# Patient Record
Sex: Female | Born: 1985 | Race: White | Hispanic: No | Marital: Married | Smoking: Never smoker
Health system: Southern US, Community
[De-identification: ages and names within clinical notes are randomized; demographics above are authoritative.]

## PROBLEM LIST (undated history)

## (undated) DIAGNOSIS — O24419 Gestational diabetes mellitus in pregnancy, unspecified control: Secondary | ICD-10-CM

## (undated) DIAGNOSIS — O039 Complete or unspecified spontaneous abortion without complication: Secondary | ICD-10-CM

## (undated) DIAGNOSIS — O321XX0 Maternal care for breech presentation, not applicable or unspecified: Secondary | ICD-10-CM

## (undated) DIAGNOSIS — Z9289 Personal history of other medical treatment: Secondary | ICD-10-CM

## (undated) DIAGNOSIS — F419 Anxiety disorder, unspecified: Secondary | ICD-10-CM

## (undated) DIAGNOSIS — Z8619 Personal history of other infectious and parasitic diseases: Secondary | ICD-10-CM

## (undated) DIAGNOSIS — Z6790 Unspecified blood type, Rh positive: Secondary | ICD-10-CM

## (undated) HISTORY — DX: Personal history of other infectious and parasitic diseases: Z86.19

## (undated) HISTORY — DX: Complete or unspecified spontaneous abortion without complication: O03.9

## (undated) HISTORY — DX: Personal history of other medical treatment: Z92.89

## (undated) HISTORY — DX: Maternal care for breech presentation, not applicable or unspecified: O32.1XX0

## (undated) HISTORY — DX: Unspecified blood type, Rh positive: Z67.90

---
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
## 2006-01-28 ENCOUNTER — Emergency Department (HOSPITAL_COMMUNITY): Admission: EM | Admit: 2006-01-28 | Discharge: 2006-01-28 | Payer: Self-pay | Admitting: Emergency Medicine

## 2008-10-04 DIAGNOSIS — O039 Complete or unspecified spontaneous abortion without complication: Secondary | ICD-10-CM

## 2008-10-04 HISTORY — DX: Complete or unspecified spontaneous abortion without complication: O03.9

## 2009-10-04 HISTORY — PX: GANGLION CYST EXCISION: SHX1691

## 2010-08-15 DIAGNOSIS — O321XX0 Maternal care for breech presentation, not applicable or unspecified: Secondary | ICD-10-CM

## 2010-08-15 HISTORY — DX: Maternal care for breech presentation, not applicable or unspecified: O32.1XX0

## 2010-08-24 ENCOUNTER — Ambulatory Visit: Payer: Self-pay

## 2010-08-25 ENCOUNTER — Inpatient Hospital Stay: Payer: Self-pay | Admitting: Obstetrics and Gynecology

## 2012-07-12 HISTORY — PX: INTRAUTERINE DEVICE (IUD) INSERTION: SHX5877

## 2013-11-22 ENCOUNTER — Ambulatory Visit: Payer: Self-pay | Admitting: Family Medicine

## 2013-12-02 HISTORY — PX: UPPER GI ENDOSCOPY: SHX6162

## 2013-12-24 ENCOUNTER — Ambulatory Visit: Payer: Self-pay | Admitting: Gastroenterology

## 2015-01-22 ENCOUNTER — Inpatient Hospital Stay
Admit: 2015-01-22 | Disposition: A | Discharge status: Home / Self Care | Payer: Self-pay | Attending: Obstetrics and Gynecology | Admitting: Obstetrics and Gynecology

## 2015-01-22 LAB — CBC WITH DIFFERENTIAL/PLATELET
BASOS ABS: 0 10*3/uL (ref 0.0–0.1)
Basophil %: 0.1 %
Eosinophil #: 0.1 10*3/uL (ref 0.0–0.7)
Eosinophil %: 0.6 %
HCT: 34.1 % — ABNORMAL LOW (ref 35.0–47.0)
HGB: 11.5 g/dL — ABNORMAL LOW (ref 12.0–16.0)
Lymphocyte #: 1.2 10*3/uL (ref 1.0–3.6)
Lymphocyte %: 10.7 %
MCH: 30.3 pg (ref 26.0–34.0)
MCHC: 33.8 g/dL (ref 32.0–36.0)
MCV: 90 fL (ref 80–100)
MONOS PCT: 5.8 %
Monocyte #: 0.7 x10 3/mm (ref 0.2–0.9)
Neutrophil #: 9.3 10*3/uL — ABNORMAL HIGH (ref 1.4–6.5)
Neutrophil %: 82.8 %
PLATELETS: 117 10*3/uL — AB (ref 150–440)
RBC: 3.81 10*6/uL (ref 3.80–5.20)
RDW: 13.5 % (ref 11.5–14.5)
WBC: 11.2 10*3/uL — AB (ref 3.6–11.0)

## 2015-01-23 LAB — HEMATOCRIT: HCT: 31.5 % — AB (ref 35.0–47.0)

## 2015-02-10 ENCOUNTER — Other Ambulatory Visit: Payer: Self-pay

## 2015-02-11 ENCOUNTER — Inpatient Hospital Stay: Admit: 2015-02-11 | Payer: BC Managed Care – PPO | Admitting: Obstetrics and Gynecology

## 2015-02-11 SURGERY — Surgical Case
Anesthesia: Choice

## 2015-02-11 NOTE — H&P (Signed)
L&D Evaluation:  History:  HPI [age] year old G3P1011 [facility] with preterm premature rupture of mebranes.  +FM, mild irregular contractions, no VB.    [facility] unremark[facility] other than history of prior LTCS for breech in 2011.  Did have echogenic focus, resolved on repeat ultrasound, negative quad screen.   Presents with leaking fluid   Patient's Medical History No Chronic Illness   Patient's Surgical History none   Medications Pre Natal Vitamins   Allergies NKDA   Social History none   Family History Non-Contributory   ROS:  ROS All systems were reviewed.  HEENT, CNS, GI, GU, Respiratory, CV, Renal and Musculoskeletal systems were found to be normal.   Exam:  Vital Signs stable   Urine Protein not completed   General no apparent distress   Mental Status clear   Heart normal sinus rhythm   Abdomen gravid, non-tender   Estimated Fetal Weight Average for gestational age   Fetal Position vtx   Back no CVAT   Edema no edema   Impression:  Impression 29 year old 473P1011 at 3545w5d presenting with preterm premature rupture of mebranes   Plan:  Comments 1) TOLAC - counseled regarding risk of TOLAC, uterine rupture rate of 1%, no clear data regarding any increased risk of ruputre with single vs double layer closure (unclear per patient prior operative note) - monitor for cervical change - if no change augment with pitocin - prior C-section for breech  2) Fetus - category I tracing - 30lbs weight gain this pregnancy - augmentin for GBS unknown  - BMZ administration (ACOG Practice Advisory: Antenatal Corticosteroid Administration in the Late Preterm Period)  3) O pos / ABSC neg / RI / VZI / HIV neg / RPR NR / 1-hr 126mg  / H&H 10.7 & 31.2 / GBS unknown  4) TDAP 12/16/13  5) Disposition pending delivery   Electronic Signatures: Lorrene ReidStaebler, Ivalee Strauser M (MD)  (Signed 20-Apr-16 07:07)  Authored: L&D Evaluation   Last Updated:  20-Apr-16 07:07 by Lorrene ReidStaebler, Radiance Deady M (MD)

## 2015-03-13 LAB — HM PAP SMEAR: HM Pap smear: NEGATIVE

## 2015-08-14 IMAGING — US ABDOMEN ULTRASOUND
1 series · 14 of 25 positions shown · non-contrast
Comparison: None.

CLINICAL DATA: Epigastric pain and abdominal bloating.

EXAM:
ULTRASOUND ABDOMEN COMPLETE

[Series 1: abdomen ultrasound · 0.20mm/px · 14 of 66 slices shown]
[im 1/66]
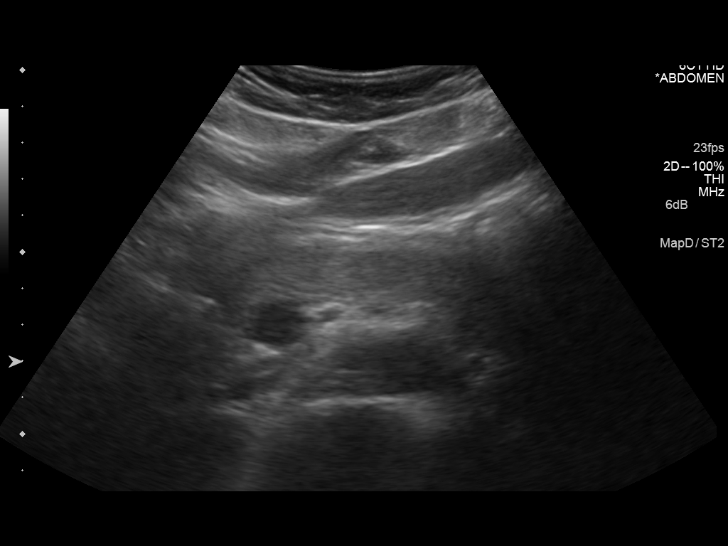
[im 6/66]
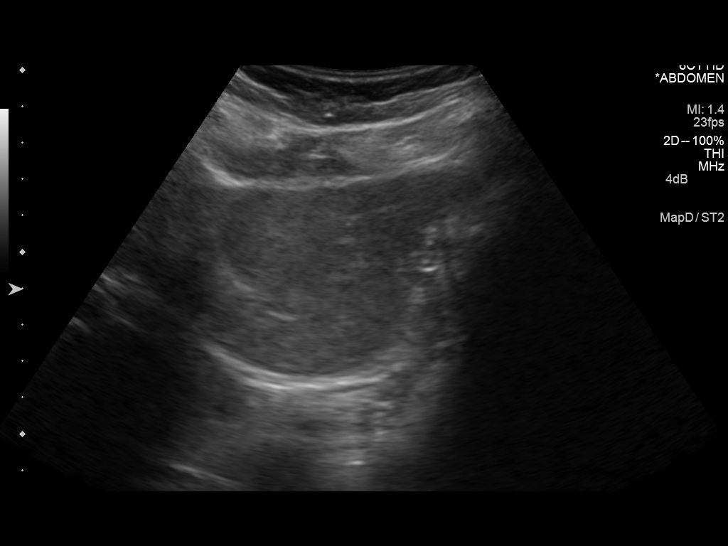
[im 11/66]
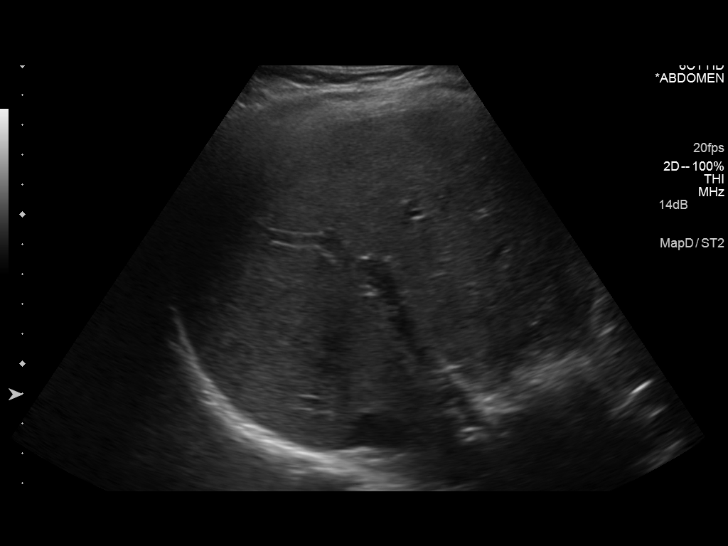
[im 17/66]
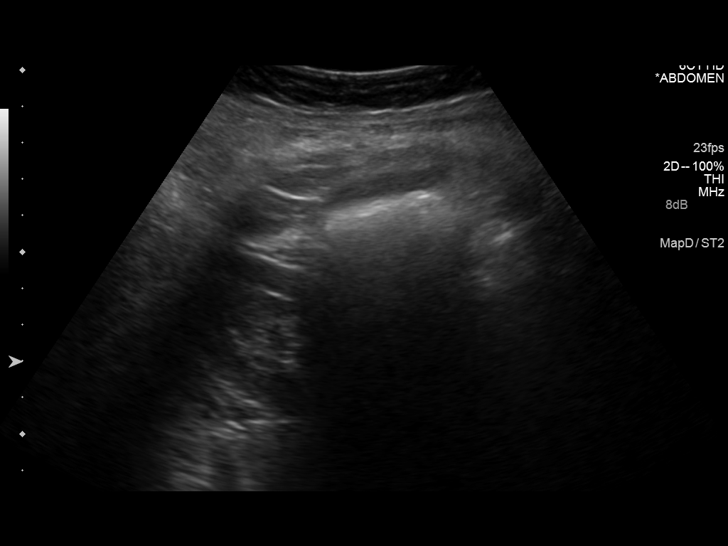
[im 22/66]
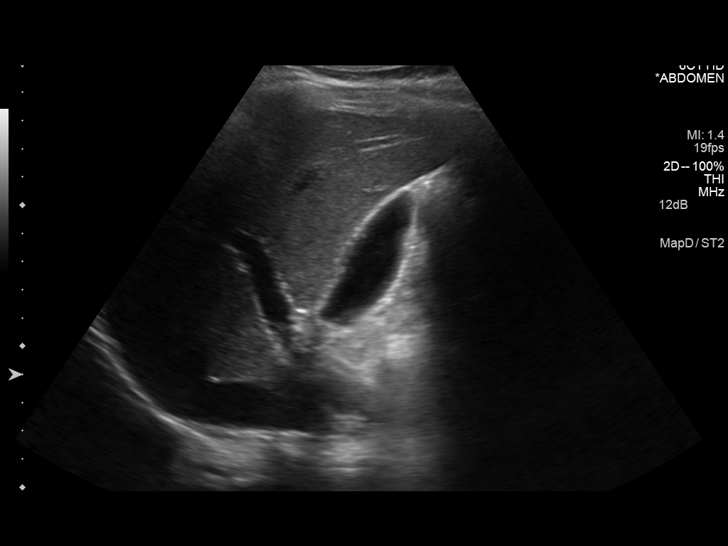
[im 25/66]
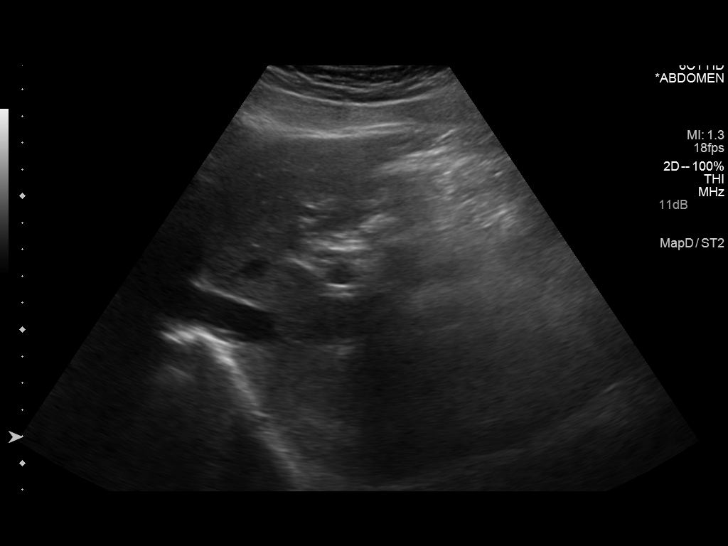
[im 30/66]
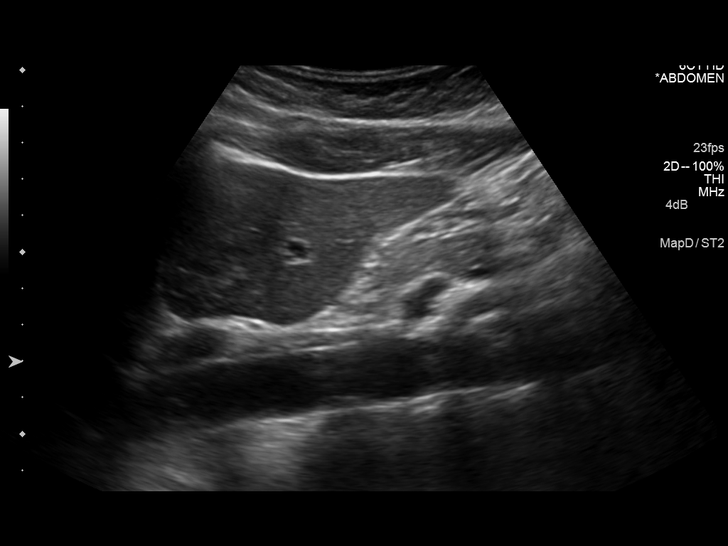
[im 36/66]
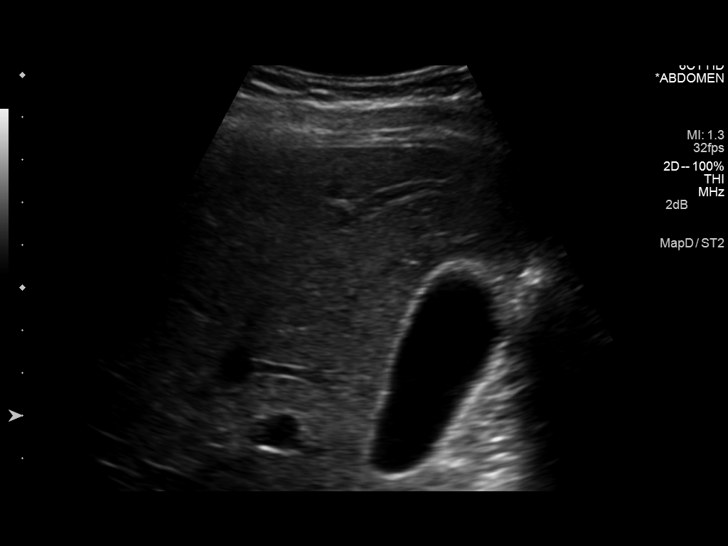
[im 41/66]
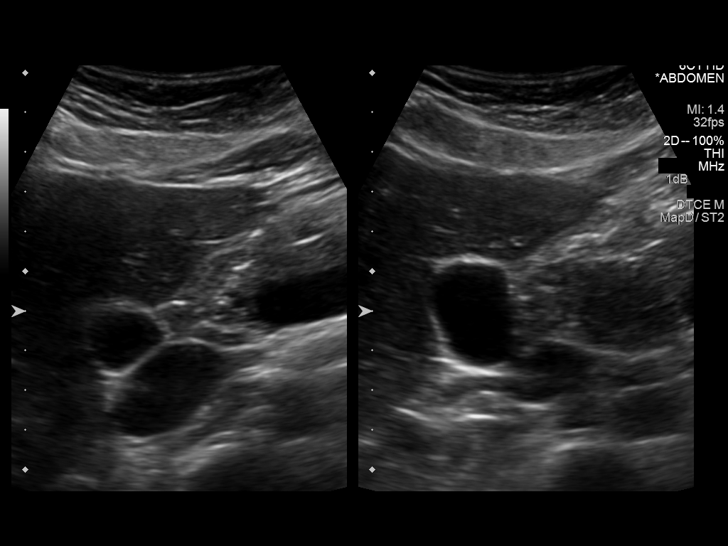
[im 44/66]
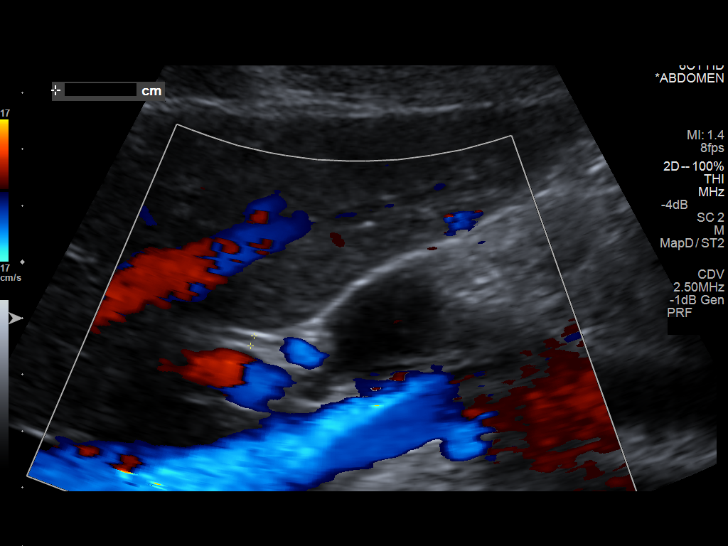
[im 49/66]
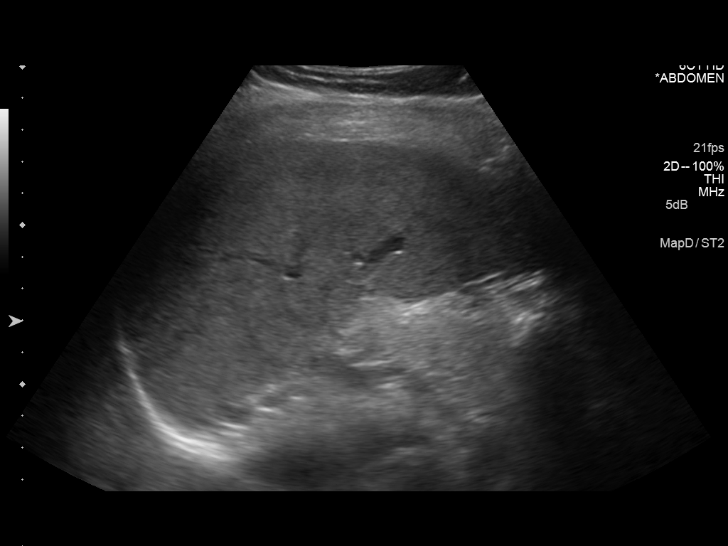
[im 55/66]
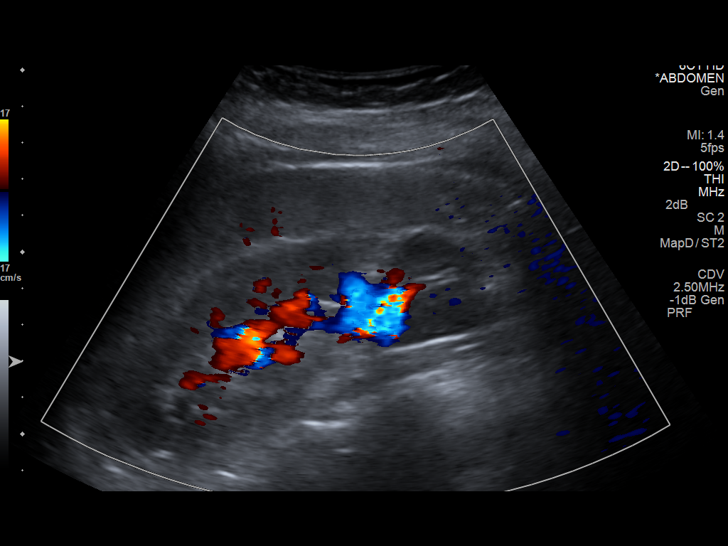
[im 60/66]
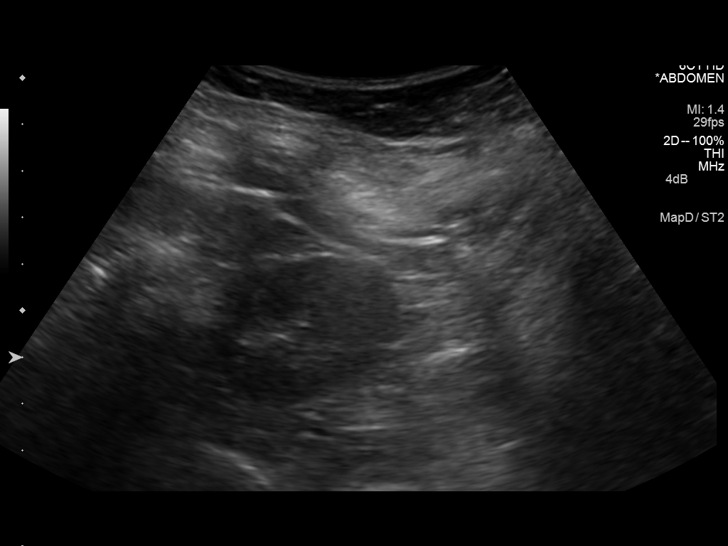
[im 66/66]
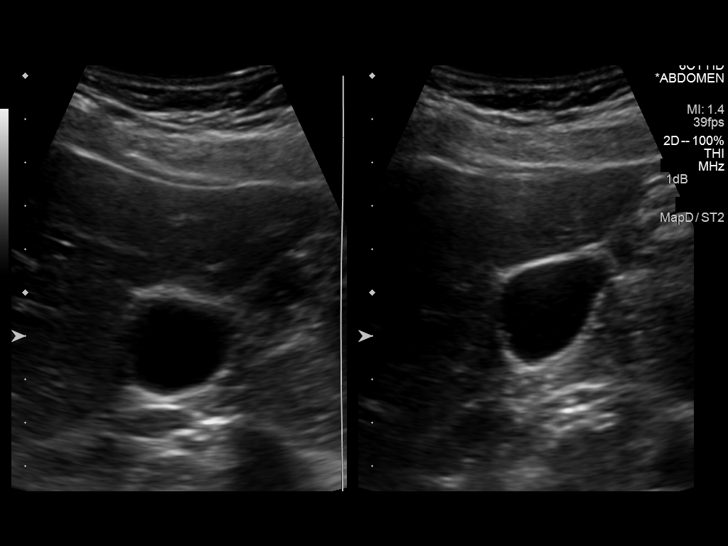

[14 of 25 positions shown; findings below may reference images not displayed]

FINDINGS: Gallbladder:

No gallstones or wall thickening visualized. No sonographic Murphy
sign noted.

Common bile duct:

Diameter: 2.4 mm.

Liver:

No focal lesion identified. Within normal limits in parenchymal
echogenicity.

IVC:

No abnormality visualized.

Pancreas:

Visualized portion unremarkable.

Spleen:

Mild splenomegaly as the spleen measures 12.3 cm in greatest
diameter with volume 517 cm 3.

Right Kidney:

Length: 11.5 cm. Echogenicity within normal limits. No mass or
hydronephrosis visualized.

Left Kidney:

Length: 12.1 cm. Echogenicity within normal limits. No mass or
hydronephrosis visualized.

Abdominal aorta:

No aneurysm visualized.

Other findings:

None.
IMPRESSION: No acute hepatobiliary findings.

Mild splenomegaly.

## 2016-05-13 ENCOUNTER — Ambulatory Visit (INDEPENDENT_AMBULATORY_CARE_PROVIDER_SITE_OTHER): Payer: BC Managed Care – PPO | Admitting: Family Medicine

## 2016-05-13 VITALS — BP 114/72 | HR 70 | Temp 98.1°F | Ht 61.0 in | Wt 144.0 lb

## 2016-05-13 DIAGNOSIS — R109 Unspecified abdominal pain: Secondary | ICD-10-CM | POA: Diagnosis not present

## 2016-05-13 MED ORDER — MELOXICAM 15 MG PO TABS: 15.0000 mg | ORAL_TABLET | Freq: Every day | ORAL | 0 refills | Status: DC

## 2016-05-13 NOTE — Progress Notes (Signed)
   BP 114/72   Pulse 70   Temp 98.1 F (36.7 C)   Ht 5\' 1"  (1.549 m)   Wt 144 lb (65.3 kg)   SpO2 99%   BMI 27.21 kg/m    Subjective:    Patient ID: Carolyn Harrison, female    DOB: March 06, 1986, 30 y.o.   MRN: [id number]  HPI: Carolyn Harrison is a 30 y.o. female  Chief Complaint  Patient presents with  . Abdominal Pain    x 1 week. left side, below rib cage. No N/V/D, no fever.   Patient presents with a 1 week history of left sided abdominal pain that seems to have started after boogie boarding at the beach last week. States the pain is 3-5/10 dull and achy at rest, 6+ and sharp when area is pressed. Located over last 2-3 ribs on lower left. Has been taking ibuprofen intermittently with some relief. No sick contacts, no new foods or foreign travel, and no N/V/D, constipation, anorexia, or fever.  Does have hx of h. Pylori infection in 2015, but states she completed successful treatment at that time and these symptoms don't feel similar.   Relevant past medical, surgical, family and social history reviewed and updated as indicated. Interim medical history since our last visit reviewed. Allergies and medications reviewed and updated.  Review of Systems  Constitutional: Negative.   Respiratory: Negative.   Cardiovascular: Negative.   Gastrointestinal: Positive for abdominal pain. Negative for constipation, diarrhea, nausea and vomiting.  Genitourinary: Negative for dysuria, flank pain, hematuria, pelvic pain and urgency.  Musculoskeletal: Negative.   Neurological: Negative.   Psychiatric/Behavioral: Negative.     Per HPI unless specifically indicated above     Objective:    BP 114/72   Pulse 70   Temp 98.1 F (36.7 C)   Ht 5\' 1"  (1.549 m)   Wt 144 lb (65.3 kg)   SpO2 99%   BMI 27.21 kg/m   Wt Readings from Last 3 Encounters:  05/13/16 144 lb (65.3 kg)    Physical Exam  Constitutional: She is oriented to person, place, and time. She appears well-developed and  well-nourished. No distress.  HENT:  Head: Atraumatic.  Eyes: Conjunctivae are normal. No scleral icterus.  Neck: Normal range of motion. Neck supple.  Cardiovascular: Normal rate and normal heart sounds.   Pulmonary/Chest: Effort normal and breath sounds normal.  Abdominal: Soft. Bowel sounds are normal. She exhibits no distension.  Moderately TTP over left lower ribs. No bruising or wound in area  Musculoskeletal: Normal range of motion.  Neurological: She is alert and oriented to person, place, and time.  Skin: Skin is warm and dry.  Psychiatric: She has a normal mood and affect. Her behavior is normal.  Nursing note and vitals reviewed.       Assessment & Plan:   Problem List Items Addressed This Visit    None    Visit Diagnoses    Abdominal pain, unspecified abdominal location    -  Primary   Suspect musculoskeletal origin, given reproducible nature and lack of symptoms suggesting internal illness. Will get basic labs just in case. Meloxicam sent.   Relevant Orders   CBC with Differential/Platelet   Comprehensive metabolic panel     Discussed red flag symptoms that she should go to the ER for. Discussed to call right away for worsening or changing symptoms.   Follow up plan: Return if symptoms worsen or fail to improve.

## 2016-05-13 NOTE — Patient Instructions (Signed)
Follow up as needed

## 2016-05-14 ENCOUNTER — Encounter: Payer: Self-pay | Admitting: Family Medicine

## 2016-05-14 LAB — COMPREHENSIVE METABOLIC PANEL
A/G RATIO: 2 (ref 1.2–2.2)
ALBUMIN: 4.5 g/dL (ref 3.5–5.5)
ALT: 14 IU/L (ref 0–32)
AST: 19 IU/L (ref 0–40)
Alkaline Phosphatase: 67 IU/L (ref 39–117)
BILIRUBIN TOTAL: 0.5 mg/dL (ref 0.0–1.2)
BUN / CREAT RATIO: 13 (ref 9–23)
BUN: 9 mg/dL (ref 6–20)
CALCIUM: 9.4 mg/dL (ref 8.7–10.2)
CO2: 23 mmol/L (ref 18–29)
Chloride: 98 mmol/L (ref 96–106)
Creatinine, Ser: 0.7 mg/dL (ref 0.57–1.00)
GFR calc non Af Amer: 115 mL/min/{1.73_m2} (ref >59)
GFR, EST AFRICAN AMERICAN: 133 mL/min/{1.73_m2} (ref >59)
Globulin, Total: 2.2 g/dL (ref 1.5–4.5)
Glucose: 112 mg/dL — ABNORMAL HIGH (ref 65–99)
POTASSIUM: 3.9 mmol/L (ref 3.5–5.2)
Sodium: 138 mmol/L (ref 134–144)
TOTAL PROTEIN: 6.7 g/dL (ref 6.0–8.5)

## 2016-05-14 LAB — CBC WITH DIFFERENTIAL/PLATELET
BASOS: 0 %
Basophils Absolute: 0 10*3/uL (ref 0.0–0.2)
EOS (ABSOLUTE): 0.1 10*3/uL (ref 0.0–0.4)
Eos: 1 %
HEMATOCRIT: 39.4 % (ref 34.0–46.6)
HEMOGLOBIN: 13.1 g/dL (ref 11.1–15.9)
IMMATURE GRANS (ABS): 0 10*3/uL (ref 0.0–0.1)
IMMATURE GRANULOCYTES: 0 %
Lymphocytes Absolute: 1.9 10*3/uL (ref 0.7–3.1)
Lymphs: 28 %
MCH: 29.2 pg (ref 26.6–33.0)
MCHC: 33.2 g/dL (ref 31.5–35.7)
MCV: 88 fL (ref 79–97)
MONOCYTES: 6 %
Monocytes Absolute: 0.4 10*3/uL (ref 0.1–0.9)
NEUTROS ABS: 4.4 10*3/uL (ref 1.4–7.0)
Neutrophils: 65 %
Platelets: 197 10*3/uL (ref 150–379)
RBC: 4.48 x10E6/uL (ref 3.77–5.28)
RDW: 12.8 % (ref 12.3–15.4)
WBC: 6.8 10*3/uL (ref 3.4–10.8)

## 2017-09-23 ENCOUNTER — Ambulatory Visit: Payer: Self-pay | Admitting: Maternal Newborn

## 2017-10-12 ENCOUNTER — Encounter: Payer: Self-pay | Admitting: Maternal Newborn

## 2017-10-12 ENCOUNTER — Ambulatory Visit (INDEPENDENT_AMBULATORY_CARE_PROVIDER_SITE_OTHER): Payer: BC Managed Care – PPO | Admitting: Maternal Newborn

## 2017-10-12 VITALS — BP 100/70 | HR 60 | Ht 61.0 in | Wt 145.0 lb

## 2017-10-12 DIAGNOSIS — Z01419 Encounter for gynecological examination (general) (routine) without abnormal findings: Secondary | ICD-10-CM

## 2017-10-12 DIAGNOSIS — Z124 Encounter for screening for malignant neoplasm of cervix: Secondary | ICD-10-CM

## 2017-10-12 DIAGNOSIS — R87619 Unspecified abnormal cytological findings in specimens from cervix uteri: Secondary | ICD-10-CM | POA: Diagnosis not present

## 2017-10-12 DIAGNOSIS — N644 Mastodynia: Secondary | ICD-10-CM | POA: Insufficient documentation

## 2017-10-12 NOTE — Progress Notes (Signed)
Gynecology Annual Exam  PCP: Particia NearingLane, Rachel Elizabeth, PA-C  Chief Complaint:  Chief Complaint  Patient presents with  . Gynecologic Exam    soreness right breast    History of Present Illness: Patient is a 32 y.o. [id number] presents for annual exam. The patient has had some pain in her right breast, especially felt during exercise. She denies nipple discharge, skin changes, and changes in breast size and shape. The pain occurred once in December while she was exercising and resolved spontaneously a few days later. She also felt the pain again this month while she was on vacation and running upstairs. It again resolved without intervention and is not currently present. She is not taking any new medications and reports no injuries to the area.  LMP: No LMP recorded (lmp unknown). Patient is not currently having periods (Reason: IUD). Intermenstrual Bleeding: occasional spotting every once in a while Postcoital Bleeding: no  The patient is sexually active. She currently uses IUD for contraception. She denies dyspareunia.  The patient does perform self breast exams.  There is no notable family history of breast or ovarian cancer in her family.  The patient wears seatbelts: yes.   The patient has regular exercise: yes.    The patient denies current symptoms of depression.    Review of Systems  Constitutional: Negative.   HENT: Negative.   Eyes: Negative.   Respiratory: Negative for cough, shortness of breath and wheezing.   Cardiovascular: Negative for chest pain and palpitations.  Gastrointestinal: Negative for abdominal pain, constipation, diarrhea, heartburn and nausea.  Genitourinary: Negative.   Musculoskeletal: Negative.   Skin: Negative for rash.  Neurological: Negative.   Endo/Heme/Allergies: Negative.   Psychiatric/Behavioral: Negative for depression. The patient is not nervous/anxious.   Breasts: intermittent pain in right breast All other systems reviewed and  negative.  Past Medical History:  Past Medical History:  Diagnosis Date  . Breech presentation 08/15/2010  . History of Helicobacter pylori infection    2015  . History of Papanicolaou smear of cervix 07/09/2011; 03/13/15   NEG;ASCUS, HPV -;  . Rh(D) positive   . Spontaneous abortion 2010    Past Surgical History:  Past Surgical History:  Procedure Laterality Date  . CESAREAN SECTION  08/25/2010   BREECH PRESENTATION  . GANGLION CYST EXCISION Right 2011  . INTRAUTERINE DEVICE (IUD) INSERTION  07/12/2012  . TONSILLECTOMY  1994  . UPPER GI ENDOSCOPY  12/2013   SL INFLAMMATION OF STOMACH LINING TESTED POS FOR H PYLORI    Gynecologic History:  No LMP recorded (lmp unknown). Patient is not currently having periods (Reason: IUD). Contraception: IUD Last Pap: 6/0/2016. Results were: ASCUS with NEGATIVE high risk HPV   Obstetric History: [id number]  Family History:  Family History  Problem Relation Age of Onset  . Ulcers Mother        stomach  . Diabetes Maternal Grandmother        GESTATIONAL; TYPE 2  . Cancer Paternal Grandmother        LEUKEMIA    Social History:  Social History   Socioeconomic History  . Marital status: Married    Spouse name: Not on file  . Number of children: 3  . Years of education: 616  . Highest education level: Not on file  Social Needs  . Financial resource strain: Not on file  . Food insecurity - worry: Not on file  . Food insecurity - inability: Not on file  . Transportation needs - medical: Not  on file  . Transportation needs - non-medical: Not on file  Occupational History  . Occupation: TEACHER    Comment: [employer]  Tobacco Use  . Smoking status: Never Smoker  . Smokeless tobacco: Never Used  Substance and Sexual Activity  . Alcohol use: No  . Drug use: No  . Sexual activity: Yes    Birth control/protection: IUD  Other Topics Concern  . Not on file  Social History Narrative  . Not on file    Allergies:  Allergies   Allergen Reactions  . Keflex [Cephalexin]     Medications: Prior to Admission medications   Medication Sig Start Date End Date Taking? Authorizing Provider  meloxicam (MOBIC) 15 MG tablet Take 1 tablet (15 mg total) by mouth daily. 05/13/16   Particia Nearing, PA-C    Physical Exam Vitals: Blood pressure 100/70, pulse 60, height 5\' 1"  (1.549 m), weight 145 lb (65.8 kg).  General: NAD HEENT: normocephalic, anicteric Thyroid: no enlargement, no palpable nodules Pulmonary: No increased work of breathing, CTAB Cardiovascular: RRR, S1 and S2 auscultated, no murmurs, rubs, or gallops Breast: Breasts symmetrical, no tenderness, no palpable nodules or masses, no skin or nipple retraction present, no nipple discharge.  No axillary or supraclavicular lymphadenopathy. Abdomen: soft, non-tender, non-distended.  Umbilicus without lesions.  No hepatomegaly, splenomegaly or masses palpable. No evidence of hernia  Genitourinary:  External: Normal external female genitalia.  Normal urethral  meatus, normal Bartholin's and Skene's glands.    Vagina: Normal vaginal mucosa, no evidence of prolapse.    Cervix: Grossly normal in appearance, no bleeding, IUD strings  visible  Uterus: Non-enlarged, mobile, normal contour.  No CMT  Adnexa: ovaries non-enlarged, no adnexal masses  Rectal: deferred  Lymphatic: no evidence of inguinal lymphadenopathy Extremities: no edema, erythema, or tenderness Neurologic: Grossly intact Psychiatric: mood appropriate, affect full  Assessment: 32 y.o. [id number] routine annual exam with intermittent right breast pain.  Plan: Problem List Items Addressed This Visit    Breast pain, right    Other Visit Diagnoses    Encounter for annual routine gynecological examination    -  Primary   Encounter for Pap cervical smear following prior abnormal smear       Relevant Orders   Pap IG and HPV (high risk) DNA detection      1) STI screening was offered and  declined.  2) ASCCP guidelines and rationale discussed.  Patient opts for yearly screening interval.  3) Contraception - Satisfied with Mirena IUD.  4) Routine healthcare maintenance including cholesterol, diabetes screening discussed: Declines.  5) Breast exam today was within normal limits. Could be musculoskeletal in origin as patient notices with exercise/vigorous movement. Will explore resources for further causes of intermittent unilateral breast pain in absence of palpable mass or other symptoms and follow up with patient by telephone.  6) Follow up 1 year for routine annual exam.  Marcelyn Bruins, CNM 10/12/2017  4:17 PM

## 2017-10-15 LAB — PAP IG AND HPV HIGH-RISK
HPV, high-risk: NEGATIVE
PAP Smear Comment: 0

## 2018-03-17 ENCOUNTER — Ambulatory Visit: Payer: BC Managed Care – PPO | Admitting: Physician Assistant

## 2018-03-17 ENCOUNTER — Encounter: Payer: Self-pay | Admitting: Physician Assistant

## 2018-03-17 VITALS — BP 102/67 | HR 66 | Temp 98.7°F | Ht 61.0 in | Wt 142.1 lb

## 2018-03-17 DIAGNOSIS — H1032 Unspecified acute conjunctivitis, left eye: Secondary | ICD-10-CM | POA: Diagnosis not present

## 2018-03-17 MED ORDER — OFLOXACIN 0.3 % OP SOLN: 1.0000 [drp] | Freq: Four times a day (QID) | OPHTHALMIC | 0 refills | Status: AC

## 2018-03-17 NOTE — Patient Instructions (Signed)

## 2018-03-20 NOTE — Progress Notes (Signed)
   Subjective:    Patient ID: Carolyn Harrison, female    DOB: 1986-01-26, 32 y.o.   MRN: [id number]  Carolyn Harrison is a 32 y.o. female presenting on 03/17/2018 for Conjunctivitis   HPI   Patient is a Dentistkindergarden teacher. Woke up this morning with left eye red and slightly painful. She is unsure if there was discharge this morning. Not sensitive to light. No vision loss. No fever, no chills.   Social History   Tobacco Use  . Smoking status: Never Smoker  . Smokeless tobacco: Never Used  Substance Use Topics  . Alcohol use: No  . Drug use: No    Review of Systems Per HPI unless specifically indicated above     Objective:    BP 102/67 (BP Location: Right Arm, Patient Position: Sitting, Cuff Size: Normal)   Pulse 66   Temp 98.7 F (37.1 C) (Oral)   Ht 5\' 1"  (1.549 m)   Wt 142 lb 1.6 oz (64.5 kg)   SpO2 99%   BMI 26.85 kg/m   Wt Readings from Last 3 Encounters:  03/17/18 142 lb 1.6 oz (64.5 kg)  10/12/17 145 lb (65.8 kg)  04/18/15 142 lb (64.4 kg)    Physical Exam  Constitutional: She appears well-developed and well-nourished.  Eyes: Pupils are equal, round, and reactive to light. EOM and lids are normal. Lids are everted and swept, no foreign bodies found. Right eye exhibits no chemosis, no discharge, no exudate and no hordeolum. No foreign body present in the right eye. Left eye exhibits no chemosis, no discharge, no exudate and no hordeolum. No foreign body present in the left eye. Right conjunctiva is not injected. Left conjunctiva is injected.   Results for orders placed or performed in visit on 10/12/17  Pap IG and HPV (high risk) DNA detection  Result Value Ref Range   DIAGNOSIS: Comment    Specimen adequacy: Comment    Clinician Provided ICD10 Comment    Performed by: Comment    PAP Smear Comment .    Note: Comment    Test Methodology Comment    HPV, high-risk Negative Negative      Assessment & Plan:   1. Acute conjunctivitis of left eye, unspecified  acute conjunctivitis type  Will cover for bacterial causes.   - ofloxacin (OCUFLOX) 0.3 % ophthalmic solution; Place 1 drop into the left eye 4 (four) times daily for 7 days.  Dispense: 1.4 mL; Refill: 0    Follow up plan: Return if symptoms worsen or fail to improve.  Osvaldo AngstAdriana Nikira Kushnir, PA-C [facility]  [facility] 03/20/2018, 3:48 PM

## 2018-03-23 ENCOUNTER — Encounter: Payer: Self-pay | Admitting: Physician Assistant

## 2018-03-23 ENCOUNTER — Ambulatory Visit (INDEPENDENT_AMBULATORY_CARE_PROVIDER_SITE_OTHER): Payer: BC Managed Care – PPO | Admitting: Physician Assistant

## 2018-03-23 VITALS — BP 113/73 | HR 62 | Ht 61.5 in | Wt 140.0 lb

## 2018-03-23 DIAGNOSIS — F419 Anxiety disorder, unspecified: Secondary | ICD-10-CM

## 2018-03-23 DIAGNOSIS — Z130 Encounter for screening for diseases of the blood and blood-forming organs and certain disorders involving the immune mechanism: Secondary | ICD-10-CM | POA: Diagnosis not present

## 2018-03-23 DIAGNOSIS — Z1329 Encounter for screening for other suspected endocrine disorder: Secondary | ICD-10-CM | POA: Diagnosis not present

## 2018-03-23 DIAGNOSIS — Z0000 Encounter for general adult medical examination without abnormal findings: Secondary | ICD-10-CM | POA: Diagnosis not present

## 2018-03-23 DIAGNOSIS — F329 Major depressive disorder, single episode, unspecified: Secondary | ICD-10-CM

## 2018-03-23 DIAGNOSIS — Z131 Encounter for screening for diabetes mellitus: Secondary | ICD-10-CM | POA: Diagnosis not present

## 2018-03-23 DIAGNOSIS — Z114 Encounter for screening for human immunodeficiency virus [HIV]: Secondary | ICD-10-CM

## 2018-03-23 DIAGNOSIS — F32A Depression, unspecified: Secondary | ICD-10-CM

## 2018-03-23 DIAGNOSIS — Z13220 Encounter for screening for lipoid disorders: Secondary | ICD-10-CM

## 2018-03-23 NOTE — Patient Instructions (Signed)

## 2018-03-23 NOTE — Progress Notes (Signed)
Subjective:    Patient ID: Carolyn Harrison, female    DOB: 1986-01-04, 32 y.o.   MRN: [id number]  Carolyn Harrison is a 32 y.o. 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 She has two children and her husband is a Astronomer. She sees gynecology and is up to date on her PAP, last PAP 10/12/2017 normal. Not due for mammogram. No family history of colon or breast cancer.   She has been having issues with anxiety and depression this past year. She thinks it may be triggered by recent struggles at school. She has had multiple parents who have complained to the principal about her. This has caused her stress because she feels she has been doing a good job at her work and that's where she feels most successful.   Past Medical History:  Diagnosis Date  . Breech presentation 08/15/2010  . History of Helicobacter pylori infection    2015  . History of Papanicolaou smear of cervix 07/09/2011; 03/13/15   NEG;ASCUS, HPV -;  . Rh(D) positive   . Spontaneous abortion 2010   Past Surgical History:  Procedure Laterality Date  . CESAREAN SECTION  08/25/2010   BREECH PRESENTATION  . GANGLION CYST EXCISION Right 2011  . INTRAUTERINE DEVICE (IUD) INSERTION  07/12/2012  . TONSILLECTOMY  1994  . UPPER GI ENDOSCOPY  12/2013   SL INFLAMMATION OF STOMACH LINING TESTED POS FOR H PYLORI   Social History   Socioeconomic History  . Marital status: Married    Spouse name: Not on file  . Number of children: 3  . Years of education: 67  . Highest education level: Not on file  Occupational History  . Occupation: TEACHER    Comment: [employer]  Social Needs  . Financial resource strain: Not on file  . Food insecurity:    Worry: Not on file    Inability: Not on file  . Transportation needs:    Medical: Not on file    Non-medical: Not on file  Tobacco Use  . Smoking status: Never  Smoker  . Smokeless tobacco: Never Used  Substance and Sexual Activity  . Alcohol use: No  . Drug use: No  . Sexual activity: Yes    Birth control/protection: IUD  Lifestyle  . Physical activity:    Days per week: Not on file    Minutes per session: Not on file  . Stress: Not on file  Relationships  . Social connections:    Talks on phone: Not on file    Gets together: Not on file    Attends religious service: Not on file    Active member of club or organization: Not on file    Attends meetings of clubs or organizations: Not on file    Relationship status: Not on file  . Intimate partner violence:    Fear of current or ex partner: Not on file    Emotionally abused: Not on file    Physically abused: Not on file    Forced sexual activity: Not on file  Other Topics Concern  . Not on file  Social History Narrative  . Not on file   Family History  Problem Relation Age of Onset  . Ulcers Mother        stomach  . Diabetes Maternal Grandmother        GESTATIONAL; TYPE 2  .  Cancer Paternal Grandmother        LEUKEMIA   Current Outpatient Medications on File Prior to Visit  Medication Sig  . levonorgestrel (MIRENA) 20 MCG/24HR IUD 1 each by Intrauterine route once.   No current facility-administered medications on file prior to visit.     Review of Systems Per HPI unless specifically indicated above     Objective:    BP 113/73   Pulse 62   Ht 5' 1.5" (1.562 m)   Wt 140 lb (63.5 kg)   SpO2 98%   BMI 26.02 kg/m   Wt Readings from Last 3 Encounters:  03/23/18 140 lb (63.5 kg)  03/17/18 142 lb 1.6 oz (64.5 kg)  10/12/17 145 lb (65.8 kg)    Physical Exam  Constitutional: She is oriented to person, place, and time. She appears well-developed and well-nourished.  Cardiovascular: Normal rate and regular rhythm.  Pulmonary/Chest: Effort normal and breath sounds normal.  Neurological: She is alert and oriented to person, place, and time.  Skin: Skin is warm and dry.    Psychiatric: She has a normal mood and affect. Her behavior is normal.   GAD 7 : Generalized Anxiety Score 03/23/2018  Nervous, Anxious, on Edge 1  Control/stop worrying 2  Worry too much - different things 2  Trouble relaxing 0  Restless 0  Easily annoyed or irritable 2  Afraid - awful might happen 1  Total GAD 7 Score 8      [facility]  PHQ-9 Total Score  7      Results for orders placed or performed in visit on 03/23/18  CBC with Differential/Platelet  Result Value Ref Range   WBC 7.2 3.4 - 10.8 x10E3/uL   RBC 4.82 3.77 - 5.28 x10E6/uL   Hemoglobin 14.1 11.1 - 15.9 g/dL   Hematocrit 16.143.9 09.634.0 - 46.6 %   MCV 91 79 - 97 fL   MCH 29.3 26.6 - 33.0 pg   MCHC 32.1 31.5 - 35.7 g/dL   RDW 04.513.0 40.912.3 - 81.115.4 %   Platelets 188 150 - 450 x10E3/uL   Neutrophils 66 Not Estab. %   Lymphs 27 Not Estab. %   Monocytes 6 Not Estab. %   Eos 1 Not Estab. %   Basos 0 Not Estab. %   Neutrophils Absolute 4.7 1.4 - 7.0 x10E3/uL   Lymphocytes Absolute 1.9 0.7 - 3.1 x10E3/uL   Monocytes Absolute 0.4 0.1 - 0.9 x10E3/uL   EOS (ABSOLUTE) 0.1 0.0 - 0.4 x10E3/uL   Basophils Absolute 0.0 0.0 - 0.2 x10E3/uL   Immature Granulocytes 0 Not Estab. %   Immature Grans (Abs) 0.0 0.0 - 0.1 x10E3/uL  Comprehensive metabolic panel  Result Value Ref Range   Glucose 82 65 - 99 mg/dL   BUN 10 6 - 20 mg/dL   Creatinine, Ser 9.140.66 0.57 - 1.00 mg/dL   GFR calc non Af Amer 116 >59 mL/min/1.73   GFR calc Af Amer 133 >59 mL/min/1.73   BUN/Creatinine Ratio 15 9 - 23   Sodium 139 134 - 144 mmol/L   Potassium 4.1 3.5 - 5.2 mmol/L   Chloride 100 96 - 106 mmol/L   CO2 25 20 - 29 mmol/L   Calcium 9.5 8.7 - 10.2 mg/dL   Total Protein 7.2 6.0 - 8.5 g/dL   Albumin 4.8 3.5 - 5.5 g/dL   Globulin, Total 2.4 1.5 - 4.5 g/dL   Albumin/Globulin Ratio 2.0 1.2 - 2.2   Bilirubin Total  0.5 0.0 - 1.2 mg/dL   Alkaline Phosphatase 60 39 - 117 IU/L   AST 18 0 - 40 IU/L   ALT 17 0 - 32  IU/L  Lipid panel  Result Value Ref Range   Cholesterol, Total 172 100 - 199 mg/dL   Triglycerides 54 0 - 149 mg/dL   HDL 70 >29 mg/dL   VLDL Cholesterol Cal 11 5 - 40 mg/dL   LDL Calculated 91 0 - 99 mg/dL   Chol/HDL Ratio 2.5 0.0 - 4.4 ratio  TSH  Result Value Ref Range   TSH 1.870 0.450 - 4.500 uIU/mL  HIV antibody (with reflex)  Result Value Ref Range   HIV Screen 4th Generation wRfx Non Reactive Non Reactive      Assessment & Plan:   1. Annual physical exam   2. Diabetes mellitus screening  - Comprehensive metabolic panel  3. Encounter for screening for HIV  - HIV antibody (with reflex)  4. Thyroid disorder screening  - TSH  5. Lipid screening  - Lipid panel  6. Screening for deficiency anemia -  CBC with Differential/Platelet  7. Anxiety and depression  Discussed course of anxiety and depression. Patient contemplating medication. She can discuss it with her family and call back if she would like to start medication. We can send in SSRI and have her follow up in one month.     Follow up plan: Return in about 1 year (around 03/24/2019) for CPE.  Osvaldo Angst, PA-C [facility] 03/27/2018, 3:02 PM

## 2018-03-24 LAB — CBC WITH DIFFERENTIAL/PLATELET
Basophils Absolute: 0 10*3/uL (ref 0.0–0.2)
Basos: 0 %
EOS (ABSOLUTE): 0.1 10*3/uL (ref 0.0–0.4)
Eos: 1 %
Hematocrit: 43.9 % (ref 34.0–46.6)
Hemoglobin: 14.1 g/dL (ref 11.1–15.9)
Immature Grans (Abs): 0 10*3/uL (ref 0.0–0.1)
Immature Granulocytes: 0 %
Lymphocytes Absolute: 1.9 10*3/uL (ref 0.7–3.1)
Lymphs: 27 %
MCH: 29.3 pg (ref 26.6–33.0)
MCHC: 32.1 g/dL (ref 31.5–35.7)
MCV: 91 fL (ref 79–97)
Monocytes Absolute: 0.4 10*3/uL (ref 0.1–0.9)
Monocytes: 6 %
Neutrophils Absolute: 4.7 10*3/uL (ref 1.4–7.0)
Neutrophils: 66 %
Platelets: 188 10*3/uL (ref 150–450)
RBC: 4.82 x10E6/uL (ref 3.77–5.28)
RDW: 13 % (ref 12.3–15.4)
WBC: 7.2 10*3/uL (ref 3.4–10.8)

## 2018-03-24 LAB — COMPREHENSIVE METABOLIC PANEL
ALT: 17 IU/L (ref 0–32)
AST: 18 IU/L (ref 0–40)
Albumin/Globulin Ratio: 2 (ref 1.2–2.2)
Albumin: 4.8 g/dL (ref 3.5–5.5)
Alkaline Phosphatase: 60 IU/L (ref 39–117)
BUN/Creatinine Ratio: 15 (ref 9–23)
BUN: 10 mg/dL (ref 6–20)
Bilirubin Total: 0.5 mg/dL (ref 0.0–1.2)
CO2: 25 mmol/L (ref 20–29)
Calcium: 9.5 mg/dL (ref 8.7–10.2)
Chloride: 100 mmol/L (ref 96–106)
Creatinine, Ser: 0.66 mg/dL (ref 0.57–1.00)
GFR calc Af Amer: 133 mL/min/{1.73_m2} (ref >59)
GFR calc non Af Amer: 116 mL/min/{1.73_m2} (ref >59)
Globulin, Total: 2.4 g/dL (ref 1.5–4.5)
Glucose: 82 mg/dL (ref 65–99)
Potassium: 4.1 mmol/L (ref 3.5–5.2)
Sodium: 139 mmol/L (ref 134–144)
Total Protein: 7.2 g/dL (ref 6.0–8.5)

## 2018-03-24 LAB — LIPID PANEL
Chol/HDL Ratio: 2.5 ratio (ref 0.0–4.4)
Cholesterol, Total: 172 mg/dL (ref 100–199)
HDL: 70 mg/dL (ref >39)
LDL Calculated: 91 mg/dL (ref 0–99)
Triglycerides: 54 mg/dL (ref 0–149)
VLDL Cholesterol Cal: 11 mg/dL (ref 5–40)

## 2018-03-24 LAB — HIV ANTIBODY (ROUTINE TESTING W REFLEX): HIV Screen 4th Generation wRfx: NONREACTIVE

## 2018-03-24 LAB — TSH: TSH: 1.87 u[IU]/mL (ref 0.450–4.500)

## 2018-03-31 ENCOUNTER — Telehealth: Payer: Self-pay | Admitting: Family Medicine

## 2018-03-31 DIAGNOSIS — F329 Major depressive disorder, single episode, unspecified: Secondary | ICD-10-CM

## 2018-03-31 DIAGNOSIS — F32A Depression, unspecified: Secondary | ICD-10-CM

## 2018-03-31 DIAGNOSIS — F419 Anxiety disorder, unspecified: Secondary | ICD-10-CM

## 2018-03-31 NOTE — Telephone Encounter (Signed)
Copied from CRM [phone]#[id number]. Topic: Quick Communication - See Telephone Encounter >> Mar 31, 2018 12:42 PM Oneal GroutSebastian, Jennifer S wrote: CRM for notification. See Telephone encounter for: 03/31/18. Calling back to follow up CPE from 03/23/18 regarding medications. Patient would like to move forward with meds, no family history experience.

## 2018-04-03 MED ORDER — FLUOXETINE HCL 20 MG PO CAPS: 20.0000 mg | ORAL_CAPSULE | Freq: Every day | ORAL | 0 refills | Status: DC

## 2018-04-03 NOTE — Telephone Encounter (Signed)
Patient notified about medication and Adriana's message.

## 2018-04-03 NOTE — Telephone Encounter (Signed)
Can start fluoxetine 20 mg once daily and she can follow up in a month. This is an SSRI, works for both anxiety and depression. Can have some slight headache, nausea, GI upset that should go away the longer she takes it in about two weeks.

## 2018-05-17 NOTE — Progress Notes (Signed)
Subjective:    Patient ID: Carolyn Harrison, female    DOB: January 04, 1986, 32 y.o.   MRN: [id number]  Carolyn Harrison is a 32 y.o. female presenting on 05/18/2018 for Medication Management (Fluoxetine); Depression; and Anxiety   HPI   Presents today for follow up for anxiety. Previously she was started on Prozac 20 mg daily. She reports this is successful. She had some side effects including headaches in the beginning but these have dissipated. She does not want to increase medications today. Son had tonsils removed and she is sleeping better through the night now as he is not waking up as frequently.   Social History   Tobacco Use  . Smoking status: Never Smoker  . Smokeless tobacco: Never Used  Substance Use Topics  . Alcohol use: No  . Drug use: No    Review of Systems Per HPI unless specifically indicated above     Objective:    BP 114/79   Pulse 66   Temp 98.1 F (36.7 C) (Oral)   Ht 5' 1.5" (1.562 m)   Wt 142 lb 6.4 oz (64.6 kg)   SpO2 97%   BMI 26.47 kg/m   Wt Readings from Last 3 Encounters:  05/18/18 142 lb 6.4 oz (64.6 kg)  03/23/18 140 lb (63.5 kg)  03/17/18 142 lb 1.6 oz (64.5 kg)    Physical Exam  Constitutional: She is oriented to person, place, and time. She appears well-developed and well-nourished.  Cardiovascular: Normal rate and regular rhythm.  Pulmonary/Chest: Effort normal and breath sounds normal.  Neurological: She is alert and oriented to person, place, and time.  Skin: Skin is warm and dry.  Psychiatric: She has a normal mood and affect. Her behavior is normal.   Results for orders placed or performed in visit on 03/23/18  CBC with Differential/Platelet  Result Value Ref Range   WBC 7.2 3.4 - 10.8 x10E3/uL   RBC 4.82 3.77 - 5.28 x10E6/uL   Hemoglobin 14.1 11.1 - 15.9 g/dL   Hematocrit 40.943.9 81.134.0 - 46.6 %   MCV 91 79 - 97 fL   MCH 29.3 26.6 - 33.0 pg   MCHC 32.1 31.5 - 35.7 g/dL   RDW 91.413.0 78.212.3 - 95.615.4 %   Platelets 188 150 - 450  x10E3/uL   Neutrophils 66 Not Estab. %   Lymphs 27 Not Estab. %   Monocytes 6 Not Estab. %   Eos 1 Not Estab. %   Basos 0 Not Estab. %   Neutrophils Absolute 4.7 1.4 - 7.0 x10E3/uL   Lymphocytes Absolute 1.9 0.7 - 3.1 x10E3/uL   Monocytes Absolute 0.4 0.1 - 0.9 x10E3/uL   EOS (ABSOLUTE) 0.1 0.0 - 0.4 x10E3/uL   Basophils Absolute 0.0 0.0 - 0.2 x10E3/uL   Immature Granulocytes 0 Not Estab. %   Immature Grans (Abs) 0.0 0.0 - 0.1 x10E3/uL  Comprehensive metabolic panel  Result Value Ref Range   Glucose 82 65 - 99 mg/dL   BUN 10 6 - 20 mg/dL   Creatinine, Ser 2.130.66 0.57 - 1.00 mg/dL   GFR calc non Af Amer 116 >59 mL/min/1.73   GFR calc Af Amer 133 >59 mL/min/1.73   BUN/Creatinine Ratio 15 9 - 23   Sodium 139 134 - 144 mmol/L   Potassium 4.1 3.5 - 5.2 mmol/L   Chloride 100 96 - 106 mmol/L   CO2 25 20 - 29 mmol/L   Calcium 9.5 8.7 - 10.2 mg/dL   Total Protein 7.2 6.0 -  8.5 g/dL   Albumin 4.8 3.5 - 5.5 g/dL   Globulin, Total 2.4 1.5 - 4.5 g/dL   Albumin/Globulin Ratio 2.0 1.2 - 2.2   Bilirubin Total 0.5 0.0 - 1.2 mg/dL   Alkaline Phosphatase 60 39 - 117 IU/L   AST 18 0 - 40 IU/L   ALT 17 0 - 32 IU/L  Lipid panel  Result Value Ref Range   Cholesterol, Total 172 100 - 199 mg/dL   Triglycerides 54 0 - 149 mg/dL   HDL 70 >09>39 mg/dL   VLDL Cholesterol Cal 11 5 - 40 mg/dL   LDL Calculated 91 0 - 99 mg/dL   Chol/HDL Ratio 2.5 0.0 - 4.4 ratio  TSH  Result Value Ref Range   TSH 1.870 0.450 - 4.500 uIU/mL  HIV antibody (with reflex)  Result Value Ref Range   HIV Screen 4th Generation wRfx Non Reactive Non Reactive      Assessment & Plan:  1. Anxiety and depression  She is stable on 20 mg Prozac and feels better on this. She does not want to increase this. Will follow up in 3 months with Roosvelt Maserachel Lane, PA-C.    Follow up plan: Return in about 3 months (around 08/18/2018) for anxiety and depression with rachel .  Osvaldo AngstAdriana Davion Flannery, PA-C [facility]  [facility] 05/18/2018, 8:14 AM

## 2018-05-18 ENCOUNTER — Encounter: Payer: Self-pay | Admitting: Physician Assistant

## 2018-05-18 ENCOUNTER — Other Ambulatory Visit: Payer: Self-pay

## 2018-05-18 ENCOUNTER — Ambulatory Visit (INDEPENDENT_AMBULATORY_CARE_PROVIDER_SITE_OTHER): Payer: BC Managed Care – PPO | Admitting: Physician Assistant

## 2018-05-18 VITALS — BP 114/79 | HR 66 | Temp 98.1°F | Ht 61.5 in | Wt 142.4 lb

## 2018-05-18 DIAGNOSIS — F329 Major depressive disorder, single episode, unspecified: Secondary | ICD-10-CM | POA: Diagnosis not present

## 2018-05-18 DIAGNOSIS — F419 Anxiety disorder, unspecified: Secondary | ICD-10-CM | POA: Diagnosis not present

## 2018-05-18 DIAGNOSIS — F32A Depression, unspecified: Secondary | ICD-10-CM

## 2018-05-18 NOTE — Patient Instructions (Signed)

## 2018-06-30 ENCOUNTER — Other Ambulatory Visit: Payer: Self-pay | Admitting: Physician Assistant

## 2018-06-30 DIAGNOSIS — F419 Anxiety disorder, unspecified: Principal | ICD-10-CM

## 2018-06-30 DIAGNOSIS — F32A Depression, unspecified: Secondary | ICD-10-CM

## 2018-06-30 DIAGNOSIS — F329 Major depressive disorder, single episode, unspecified: Secondary | ICD-10-CM

## 2018-08-21 ENCOUNTER — Encounter: Payer: Self-pay | Admitting: Family Medicine

## 2018-08-21 ENCOUNTER — Ambulatory Visit: Payer: BC Managed Care – PPO | Admitting: Family Medicine

## 2018-08-21 VITALS — BP 125/85 | HR 68 | Temp 98.8°F | Ht 61.5 in | Wt 145.4 lb

## 2018-08-21 DIAGNOSIS — F341 Dysthymic disorder: Secondary | ICD-10-CM | POA: Diagnosis not present

## 2018-08-21 DIAGNOSIS — R21 Rash and other nonspecific skin eruption: Secondary | ICD-10-CM | POA: Diagnosis not present

## 2018-08-21 DIAGNOSIS — F329 Major depressive disorder, single episode, unspecified: Secondary | ICD-10-CM | POA: Insufficient documentation

## 2018-08-21 DIAGNOSIS — F32A Depression, unspecified: Secondary | ICD-10-CM | POA: Insufficient documentation

## 2018-08-21 MED ORDER — FLUOXETINE HCL 40 MG PO CAPS: 40.0000 mg | ORAL_CAPSULE | Freq: Every day | ORAL | 1 refills | Status: DC

## 2018-08-21 MED ORDER — KETOCONAZOLE 2 % EX CREA: 1.0000 "application " | TOPICAL_CREAM | Freq: Every day | CUTANEOUS | 0 refills | Status: DC

## 2018-08-21 MED ORDER — HYDROXYZINE HCL 25 MG PO TABS: 25.0000 mg | ORAL_TABLET | Freq: Three times a day (TID) | ORAL | 0 refills | Status: DC | PRN

## 2018-08-21 NOTE — Progress Notes (Signed)
BP 125/85 (BP Location: Right Arm, Patient Position: Sitting, Cuff Size: Normal)   Pulse 68   Temp 98.8 F (37.1 C)   Ht 5' 1.5" (1.562 m)   Wt 145 lb 7 oz (66 kg)   SpO2 98%   BMI 27.04 kg/m    Subjective:    Patient ID: Carolyn Harrison, female    DOB: 1985/12/08, 32 y.o.   MRN: [id number]  HPI: Carolyn Harrison is a 32 y.o. female  Chief Complaint  Patient presents with  . Anxiety  . Depression   Here today for mood f/u. Taking 20 mg prozac daily without side effects. Over the summer, felt significant benefit from this dose. Now feels like she has been having some consistent low periods followed by a few very high days. Lots of work stress and home stress right now. Denies SI/HI, appetite issues, hallucinations. Does have some issues sleeping due to stress.   Itchy skin and redness on underarms and back of neck the past year or so. Tried neosporin, aquaphor, changing deodorants and soaps with no benefit. right underarm affected right now.   Depression screen [facility] 2/9 08/21/2018 05/18/2018 03/23/2018  Decreased Interest 1 1 1   Down, Depressed, Hopeless 2 0 1  PHQ - 2 Score 3 1 2   Altered sleeping 1 0 2  Tired, decreased energy 1 0 1  Change in appetite 0 0 1  Feeling bad or failure about yourself  2 0 1  Trouble concentrating 2 0 0  Moving slowly or fidgety/restless 0 0 0  Suicidal thoughts 0 0 0  PHQ-9 Score 9 1 7   Difficult doing work/chores Somewhat difficult - -    Relevant past medical, surgical, family and social history reviewed and updated as indicated. Interim medical history since our last visit reviewed. Allergies and medications reviewed and updated.  Review of Systems  Per HPI unless specifically indicated above     Objective:    BP 125/85 (BP Location: Right Arm, Patient Position: Sitting, Cuff Size: Normal)   Pulse 68   Temp 98.8 F (37.1 C)   Ht 5' 1.5" (1.562 m)   Wt 145 lb 7 oz (66 kg)   SpO2 98%   BMI 27.04 kg/m   Wt Readings from Last 3  Encounters:  08/21/18 145 lb 7 oz (66 kg)  05/18/18 142 lb 6.4 oz (64.6 kg)  03/23/18 140 lb (63.5 kg)    Physical Exam  Constitutional: She is oriented to person, place, and time. She appears well-developed and well-nourished. No distress.  HENT:  Head: Atraumatic.  Eyes: Conjunctivae and EOM are normal.  Neck: Normal range of motion. Neck supple.  Cardiovascular: Normal rate, regular rhythm and normal heart sounds.  Pulmonary/Chest: Effort normal and breath sounds normal.  Musculoskeletal: Normal range of motion.  Neurological: She is alert and oriented to person, place, and time.  Skin: Skin is warm and dry. Rash (erythematous maculopapular patch right axilla) noted.  Psychiatric: She has a normal mood and affect. Her behavior is normal.  Nursing note and vitals reviewed.   Results for orders placed or performed in visit on 03/23/18  CBC with Differential/Platelet  Result Value Ref Range   WBC 7.2 3.4 - 10.8 x10E3/uL   RBC 4.82 3.77 - 5.28 x10E6/uL   Hemoglobin 14.1 11.1 - 15.9 g/dL   Hematocrit 40.9 81.1 - 46.6 %   MCV 91 79 - 97 fL   MCH 29.3 26.6 - 33.0 pg   MCHC 32.1 31.5 -  35.7 g/dL   RDW 16.113.0 09.612.3 - 04.515.4 %   Platelets 188 150 - 450 x10E3/uL   Neutrophils 66 Not Estab. %   Lymphs 27 Not Estab. %   Monocytes 6 Not Estab. %   Eos 1 Not Estab. %   Basos 0 Not Estab. %   Neutrophils Absolute 4.7 1.4 - 7.0 x10E3/uL   Lymphocytes Absolute 1.9 0.7 - 3.1 x10E3/uL   Monocytes Absolute 0.4 0.1 - 0.9 x10E3/uL   EOS (ABSOLUTE) 0.1 0.0 - 0.4 x10E3/uL   Basophils Absolute 0.0 0.0 - 0.2 x10E3/uL   Immature Granulocytes 0 Not Estab. %   Immature Grans (Abs) 0.0 0.0 - 0.1 x10E3/uL  Comprehensive metabolic panel  Result Value Ref Range   Glucose 82 65 - 99 mg/dL   BUN 10 6 - 20 mg/dL   Creatinine, Ser 4.090.66 0.57 - 1.00 mg/dL   GFR calc non Af Amer 116 >59 mL/min/1.73   GFR calc Af Amer 133 >59 mL/min/1.73   BUN/Creatinine Ratio 15 9 - 23   Sodium 139 134 - 144 mmol/L    Potassium 4.1 3.5 - 5.2 mmol/L   Chloride 100 96 - 106 mmol/L   CO2 25 20 - 29 mmol/L   Calcium 9.5 8.7 - 10.2 mg/dL   Total Protein 7.2 6.0 - 8.5 g/dL   Albumin 4.8 3.5 - 5.5 g/dL   Globulin, Total 2.4 1.5 - 4.5 g/dL   Albumin/Globulin Ratio 2.0 1.2 - 2.2   Bilirubin Total 0.5 0.0 - 1.2 mg/dL   Alkaline Phosphatase 60 39 - 117 IU/L   AST 18 0 - 40 IU/L   ALT 17 0 - 32 IU/L  Lipid panel  Result Value Ref Range   Cholesterol, Total 172 100 - 199 mg/dL   Triglycerides 54 0 - 149 mg/dL   HDL 70 >81>39 mg/dL   VLDL Cholesterol Cal 11 5 - 40 mg/dL   LDL Calculated 91 0 - 99 mg/dL   Chol/HDL Ratio 2.5 0.0 - 4.4 ratio  TSH  Result Value Ref Range   TSH 1.870 0.450 - 4.500 uIU/mL  HIV antibody (with reflex)  Result Value Ref Range   HIV Screen 4th Generation wRfx Non Reactive Non Reactive      Assessment & Plan:   Problem List Items Addressed This Visit      Other   Depression - Primary    Will try increasing to 40 mg prozac and prn hydroxyzine. If still having swings from high to low, will add a mood stabilizer.       Relevant Medications   FLUoxetine (PROZAC) 40 MG capsule   hydrOXYzine (ATARAX/VISTARIL) 25 MG tablet    Other Visit Diagnoses    Rash       Appears fungal, trial ketoconazole cream, unscented deodorants and powders to area       Follow up plan: Return in about 4 weeks (around 09/18/2018) for Mood f/u.

## 2018-08-21 NOTE — Patient Instructions (Signed)
Follow up in 1 month   

## 2018-08-21 NOTE — Assessment & Plan Note (Signed)
Will try increasing to 40 mg prozac and prn hydroxyzine. If still having swings from high to low, will add a mood stabilizer.

## 2018-09-05 ENCOUNTER — Encounter: Payer: Self-pay | Admitting: Family Medicine

## 2018-09-18 ENCOUNTER — Ambulatory Visit: Payer: BC Managed Care – PPO | Admitting: Family Medicine

## 2018-09-22 ENCOUNTER — Ambulatory Visit: Payer: BC Managed Care – PPO | Admitting: Family Medicine

## 2018-09-22 ENCOUNTER — Encounter: Payer: Self-pay | Admitting: Family Medicine

## 2018-09-22 VITALS — BP 119/81 | HR 72 | Temp 98.8°F | Ht 61.5 in | Wt 143.5 lb

## 2018-09-22 DIAGNOSIS — F341 Dysthymic disorder: Secondary | ICD-10-CM

## 2018-09-22 MED ORDER — FLUOXETINE HCL 40 MG PO CAPS: 40.0000 mg | ORAL_CAPSULE | Freq: Every day | ORAL | 0 refills | Status: DC

## 2018-09-22 MED ORDER — TRIAMCINOLONE ACETONIDE 0.1 % EX CREA: 1.0000 "application " | TOPICAL_CREAM | Freq: Two times a day (BID) | CUTANEOUS | 0 refills | Status: DC

## 2018-09-22 NOTE — Progress Notes (Signed)
BP 119/81   Pulse 72   Temp 98.8 F (37.1 C) (Oral)   Ht 5' 1.5" (1.562 m)   Wt 143 lb 8 oz (65.1 kg)   SpO2 99%   BMI 26.68 kg/m    Subjective:    Patient ID: Carolyn Harrison, female    DOB: Apr 25, 1986, [age] y.o.   MRN: [id number]  HPI: Carolyn Harrison is a 32 y.o. female  Chief Complaint  Patient presents with  . Anxiety  . Depression   Here today for depression and anxiety f/u after increasing prozac to 40 mg and adding prn hydroxyzine. Doing well on this regimen. Still not quite where she wants to be but thinks partially this may be due to the season and stressors. Denies SI/HI, side effects, sleep or appetite issues.   [facility] 09/22/2018 08/21/2018 05/18/2018  Decreased Interest 0 1 1  Down, Depressed, Hopeless 1 2 0  PHQ - 2 Score 1 3 1   Altered sleeping 1 1 0  Tired, decreased energy 0 1 0  Change in appetite 0 0 0  Feeling bad or failure about yourself  0 2 0  Trouble concentrating 0 2 0  Moving slowly or fidgety/restless 0 0 0  Suicidal thoughts 0 0 0  PHQ-9 Score 2 9 1   Difficult doing work/chores - Somewhat difficult -   GAD 7 : Generalized Anxiety Score 09/22/2018 08/21/2018 05/18/2018 03/23/2018  Nervous, Anxious, on Edge 0 2 1 1   Control/stop worrying 1 3 0 2  Worry too much - different things 1 3 1 2   Trouble relaxing 0 3 0 0  Restless 0 3 0 0  Easily annoyed or irritable 0 2 0 2  Afraid - awful might happen 0 2 0 1  Total GAD 7 Score 2 18 2 8   Anxiety Difficulty Not difficult at all Somewhat difficult - -     Relevant past medical, surgical, family and social history reviewed and updated as indicated. Interim medical history since our last visit reviewed. Allergies and medications reviewed and updated.  Review of Systems  Per HPI unless specifically indicated above     Objective:    BP 119/81   Pulse 72   Temp 98.8 F (37.1 C) (Oral)   Ht 5' 1.5" (1.562 m)   Wt 143 lb 8 oz (65.1 kg)   SpO2 99%   BMI 26.68 kg/m   Wt  Readings from Last 3 Encounters:  09/22/18 143 lb 8 oz (65.1 kg)  08/21/18 145 lb 7 oz (66 kg)  05/18/18 142 lb 6.4 oz (64.6 kg)    Physical Exam Vitals signs and nursing note reviewed.  Constitutional:      Appearance: Normal appearance. She is not ill-appearing.  HENT:     Head: Atraumatic.  Eyes:     Extraocular Movements: Extraocular movements intact.     Conjunctiva/sclera: Conjunctivae normal.  Neck:     Musculoskeletal: Normal range of motion and neck supple.  Cardiovascular:     Rate and Rhythm: Normal rate and regular rhythm.     Heart sounds: Normal heart sounds.  Pulmonary:     Effort: Pulmonary effort is normal.     Breath sounds: Normal breath sounds.  Musculoskeletal: Normal range of motion.  Skin:    General: Skin is warm and dry.  Neurological:     Mental Status: She is alert and oriented to person, place, and time.  Psychiatric:        Mood  and Affect: Mood normal.        Thought Content: Thought content normal.        Judgment: Judgment normal.     Results for orders placed or performed in visit on 03/23/18  CBC with Differential/Platelet  Result Value Ref Range   WBC 7.2 3.4 - 10.8 x10E3/uL   RBC 4.82 3.77 - 5.28 x10E6/uL   Hemoglobin 14.1 11.1 - 15.9 g/dL   Hematocrit 16.143.9 09.634.0 - 46.6 %   MCV 91 79 - 97 fL   MCH 29.3 26.6 - 33.0 pg   MCHC 32.1 31.5 - 35.7 g/dL   RDW 04.513.0 40.912.3 - 81.115.4 %   Platelets 188 150 - 450 x10E3/uL   Neutrophils 66 Not Estab. %   Lymphs 27 Not Estab. %   Monocytes 6 Not Estab. %   Eos 1 Not Estab. %   Basos 0 Not Estab. %   Neutrophils Absolute 4.7 1.4 - 7.0 x10E3/uL   Lymphocytes Absolute 1.9 0.7 - 3.1 x10E3/uL   Monocytes Absolute 0.4 0.1 - 0.9 x10E3/uL   EOS (ABSOLUTE) 0.1 0.0 - 0.4 x10E3/uL   Basophils Absolute 0.0 0.0 - 0.2 x10E3/uL   Immature Granulocytes 0 Not Estab. %   Immature Grans (Abs) 0.0 0.0 - 0.1 x10E3/uL  Comprehensive metabolic panel  Result Value Ref Range   Glucose 82 65 - 99 mg/dL   BUN 10 6 -  20 mg/dL   Creatinine, Ser 9.140.66 0.57 - 1.00 mg/dL   GFR calc non Af Amer 116 >59 mL/min/1.73   GFR calc Af Amer 133 >59 mL/min/1.73   BUN/Creatinine Ratio 15 9 - 23   Sodium 139 134 - 144 mmol/L   Potassium 4.1 3.5 - 5.2 mmol/L   Chloride 100 96 - 106 mmol/L   CO2 25 20 - 29 mmol/L   Calcium 9.5 8.7 - 10.2 mg/dL   Total Protein 7.2 6.0 - 8.5 g/dL   Albumin 4.8 3.5 - 5.5 g/dL   Globulin, Total 2.4 1.5 - 4.5 g/dL   Albumin/Globulin Ratio 2.0 1.2 - 2.2   Bilirubin Total 0.5 0.0 - 1.2 mg/dL   Alkaline Phosphatase 60 39 - 117 IU/L   AST 18 0 - 40 IU/L   ALT 17 0 - 32 IU/L  Lipid panel  Result Value Ref Range   Cholesterol, Total 172 100 - 199 mg/dL   Triglycerides 54 0 - 149 mg/dL   HDL 70 >78>39 mg/dL   VLDL Cholesterol Cal 11 5 - 40 mg/dL   LDL Calculated 91 0 - 99 mg/dL   Chol/HDL Ratio 2.5 0.0 - 4.4 ratio  TSH  Result Value Ref Range   TSH 1.870 0.450 - 4.500 uIU/mL  HIV antibody (with reflex)  Result Value Ref Range   HIV Screen 4th Generation wRfx Non Reactive Non Reactive      Assessment & Plan:   Problem List Items Addressed This Visit      Other   Depression - Primary    Discussed options with patient, she would like to give current dose/regimen more time and see if things improve once life settles down some. Continue current regimen      Relevant Medications   FLUoxetine (PROZAC) 40 MG capsule       Follow up plan: Return in about 3 months (around 12/22/2018) for Mood f/u.

## 2018-09-24 NOTE — Assessment & Plan Note (Signed)
Discussed options with patient, she would like to give current dose/regimen more time and see if things improve once life settles down some. Continue current regimen

## 2018-10-02 ENCOUNTER — Encounter: Payer: Self-pay | Admitting: Family Medicine

## 2018-10-23 ENCOUNTER — Encounter: Payer: Self-pay | Admitting: Family Medicine

## 2018-10-23 ENCOUNTER — Ambulatory Visit (INDEPENDENT_AMBULATORY_CARE_PROVIDER_SITE_OTHER): Payer: BC Managed Care – PPO | Admitting: Family Medicine

## 2018-10-23 ENCOUNTER — Telehealth: Payer: Self-pay | Admitting: Family Medicine

## 2018-10-23 VITALS — BP 118/82 | HR 94 | Temp 100.2°F | Ht 61.5 in | Wt 143.2 lb

## 2018-10-23 DIAGNOSIS — J101 Influenza due to other identified influenza virus with other respiratory manifestations: Secondary | ICD-10-CM

## 2018-10-23 LAB — VERITOR FLU A/B WAIVED
INFLUENZA A: NEGATIVE
Influenza B: POSITIVE — AB

## 2018-10-23 MED ORDER — HYDROCOD POLST-CPM POLST ER 10-8 MG/5ML PO SUER: 5.0000 mL | Freq: Every evening | ORAL | 0 refills | Status: DC | PRN

## 2018-10-23 MED ORDER — OSELTAMIVIR PHOSPHATE 75 MG PO CAPS: 75.0000 mg | ORAL_CAPSULE | Freq: Two times a day (BID) | ORAL | 0 refills | Status: DC

## 2018-10-23 MED ORDER — BENZONATATE 200 MG PO CAPS: 200.0000 mg | ORAL_CAPSULE | Freq: Three times a day (TID) | ORAL | 0 refills | Status: DC | PRN

## 2018-10-23 NOTE — Progress Notes (Signed)
BP 118/82 (BP Location: Left Arm, Patient Position: Sitting, Cuff Size: Normal)   Pulse 94   Temp 100.2 F (37.9 C)   Ht 5' 1.5" (1.562 m)   Wt 143 lb 3 oz (64.9 kg)   SpO2 97%   BMI 26.62 kg/m    Subjective:    Patient ID: Carolyn SimasAshley L Harrison, female    DOB: 1986/06/02, 33 y.o.   MRN: [id number]  HPI: Carolyn Simasshley L Harrison is a 33 y.o. female  Chief Complaint  Patient presents with  . Fever    body aches and cough   2 days of sore throat, body aches, fevers, headache, coughing. Denies CP, SOB, N/V/D. Taking dayquil, nyquil with minimal relief. Lots of sick contacts, works as a Runner, broadcasting/film/videoteacher. No hx of pulmonary dz. Non smoker.   Relevant past medical, surgical, family and social history reviewed and updated as indicated. Interim medical history since our last visit reviewed. Allergies and medications reviewed and updated.  Review of Systems  Per HPI unless specifically indicated above     Objective:    BP 118/82 (BP Location: Left Arm, Patient Position: Sitting, Cuff Size: Normal)   Pulse 94   Temp 100.2 F (37.9 C)   Ht 5' 1.5" (1.562 m)   Wt 143 lb 3 oz (64.9 kg)   SpO2 97%   BMI 26.62 kg/m   Wt Readings from Last 3 Encounters:  10/23/18 143 lb 3 oz (64.9 kg)  09/22/18 143 lb 8 oz (65.1 kg)  08/21/18 145 lb 7 oz (66 kg)    Physical Exam Vitals signs and nursing note reviewed.  Constitutional:      Appearance: Normal appearance. She is ill-appearing.  HENT:     Head: Atraumatic.     Right Ear: Tympanic membrane and external ear normal.     Left Ear: Tympanic membrane and external ear normal.     Nose: Congestion present.     Mouth/Throat:     Mouth: Mucous membranes are moist.     Pharynx: Posterior oropharyngeal erythema present.  Eyes:     Extraocular Movements: Extraocular movements intact.     Conjunctiva/sclera: Conjunctivae normal.  Neck:     Musculoskeletal: Normal range of motion and neck supple.  Cardiovascular:     Rate and Rhythm: Normal rate and  regular rhythm.     Heart sounds: Normal heart sounds.  Pulmonary:     Effort: Pulmonary effort is normal.     Breath sounds: Normal breath sounds. No wheezing.  Abdominal:     General: Bowel sounds are normal.     Palpations: Abdomen is soft.     Tenderness: There is no abdominal tenderness.  Musculoskeletal: Normal range of motion.  Skin:    General: Skin is warm and dry.  Neurological:     Mental Status: She is alert and oriented to person, place, and time.  Psychiatric:        Mood and Affect: Mood normal.        Thought Content: Thought content normal.     Results for orders placed or performed in visit on 03/23/18  CBC with Differential/Platelet  Result Value Ref Range   WBC 7.2 3.4 - 10.8 x10E3/uL   RBC 4.82 3.77 - 5.28 x10E6/uL   Hemoglobin 14.1 11.1 - 15.9 g/dL   Hematocrit 03.443.9 74.234.0 - 46.6 %   MCV 91 79 - 97 fL   MCH 29.3 26.6 - 33.0 pg   MCHC 32.1 31.5 - 35.7 g/dL  RDW 13.0 12.3 - 15.4 %   Platelets 188 150 - 450 x10E3/uL   Neutrophils 66 Not Estab. %   Lymphs 27 Not Estab. %   Monocytes 6 Not Estab. %   Eos 1 Not Estab. %   Basos 0 Not Estab. %   Neutrophils Absolute 4.7 1.4 - 7.0 x10E3/uL   Lymphocytes Absolute 1.9 0.7 - 3.1 x10E3/uL   Monocytes Absolute 0.4 0.1 - 0.9 x10E3/uL   EOS (ABSOLUTE) 0.1 0.0 - 0.4 x10E3/uL   Basophils Absolute 0.0 0.0 - 0.2 x10E3/uL   Immature Granulocytes 0 Not Estab. %   Immature Grans (Abs) 0.0 0.0 - 0.1 x10E3/uL  Comprehensive metabolic panel  Result Value Ref Range   Glucose 82 65 - 99 mg/dL   BUN 10 6 - 20 mg/dL   Creatinine, Ser 8.54 0.57 - 1.00 mg/dL   GFR calc non Af Amer 116 >59 mL/min/1.73   GFR calc Af Amer 133 >59 mL/min/1.73   BUN/Creatinine Ratio 15 9 - 23   Sodium 139 134 - 144 mmol/L   Potassium 4.1 3.5 - 5.2 mmol/L   Chloride 100 96 - 106 mmol/L   CO2 25 20 - 29 mmol/L   Calcium 9.5 8.7 - 10.2 mg/dL   Total Protein 7.2 6.0 - 8.5 g/dL   Albumin 4.8 3.5 - 5.5 g/dL   Globulin, Total 2.4 1.5 - 4.5 g/dL     Albumin/Globulin Ratio 2.0 1.2 - 2.2   Bilirubin Total 0.5 0.0 - 1.2 mg/dL   Alkaline Phosphatase 60 39 - 117 IU/L   AST 18 0 - 40 IU/L   ALT 17 0 - 32 IU/L  Lipid panel  Result Value Ref Range   Cholesterol, Total 172 100 - 199 mg/dL   Triglycerides 54 0 - 149 mg/dL   HDL 70 >62 mg/dL   VLDL Cholesterol Cal 11 5 - 40 mg/dL   LDL Calculated 91 0 - 99 mg/dL   Chol/HDL Ratio 2.5 0.0 - 4.4 ratio  TSH  Result Value Ref Range   TSH 1.870 0.450 - 4.500 uIU/mL  HIV antibody (with reflex)  Result Value Ref Range   HIV Screen 4th Generation wRfx Non Reactive Non Reactive      Assessment & Plan:   Problem List Items Addressed This Visit    None    Visit Diagnoses    Influenza B    -  Primary   Tx with tamiflu, tessalon, tussionex. Supportive care and return precautions reviewed. F/u if not improving   Relevant Medications   oseltamivir (TAMIFLU) 75 MG capsule   Other Relevant Orders   Veritor Flu A/B Waived       Follow up plan: Return if symptoms worsen or fail to improve.

## 2018-10-23 NOTE — Telephone Encounter (Signed)
Rx changed to [facility] [place]

## 2018-10-23 NOTE — Telephone Encounter (Signed)
Copied from CRM [id number]. Topic: General - Other >> Oct 23, 2018 11:47 AM Leafy Ro wrote: Reason for CRM: pt is calling the [facility] in [place] [address]  told her they can not get medication and to get rx from [facility] in [place] and they can not transfer.Pt does not have address etc to [facility] in [place]   Pt needs new rx chlorpheniramine-hydrocodone

## 2018-12-28 ENCOUNTER — Telehealth: Payer: Self-pay | Admitting: Family Medicine

## 2018-12-28 NOTE — Telephone Encounter (Signed)
Called pt to set up virtual pt will use skype verified email phone dropped called back no response

## 2018-12-29 ENCOUNTER — Encounter: Payer: Self-pay | Admitting: Family Medicine

## 2018-12-29 ENCOUNTER — Ambulatory Visit (INDEPENDENT_AMBULATORY_CARE_PROVIDER_SITE_OTHER): Payer: BC Managed Care – PPO | Admitting: Family Medicine

## 2018-12-29 ENCOUNTER — Other Ambulatory Visit: Payer: Self-pay

## 2018-12-29 DIAGNOSIS — F341 Dysthymic disorder: Secondary | ICD-10-CM | POA: Diagnosis not present

## 2018-12-29 DIAGNOSIS — F419 Anxiety disorder, unspecified: Secondary | ICD-10-CM | POA: Diagnosis not present

## 2018-12-29 MED ORDER — FLUOXETINE HCL 40 MG PO CAPS: 40.0000 mg | ORAL_CAPSULE | Freq: Every day | ORAL | 0 refills | Status: DC

## 2018-12-29 NOTE — Assessment & Plan Note (Signed)
Moods well controlled with prozac, continue current regimen

## 2018-12-29 NOTE — Progress Notes (Signed)
There were no vitals taken for this visit.   Subjective:    Patient ID: Carolyn Harrison, female    DOB: [birth year], 33 y.o.   MRN: [id number]  HPI: ELLAINE Harrison is a 33 y.o. female  Chief Complaint  Patient presents with  . Depression    . This visit was completed via WebEx due to the restrictions of the COVID-19 pandemic. All issues as above were discussed and addressed. Physical exam was done as above through visual confirmation on WebEx. If it was felt that the patient should be evaluated in the office, they were directed there. The patient verbally consented to this visit. . Location of the patient: home . Location of the provider: home . Those involved with this call:  . Provider: Roosvelt Maser, PA-C . CMA: Wilhemena Durie, CMA . Front Desk/Registration: Harriet Pho  . Time spent on call: 20 minutes with patient face to face via video conference. More than 50% of this time was spent in counseling and coordination of care.    3 month depression and anxiety f/u. Currently on 40 mg prozac and taking hydroxyzine prn for breakthrough anxiety. Was doing very well on this regimen until the COVID pandemic, which has brought along with it significant anxiety. Moods though are stable and she feels like she is dealing with everything in a productive manner. No SI/HI, severe mood swings, appetite issues.   Depression screen [facility] 2/9 12/29/2018 09/22/2018 08/21/2018  Decreased Interest 0 0 1  Down, Depressed, Hopeless 1 1 2   PHQ - 2 Score 1 1 3   Altered sleeping 1 1 1   Tired, decreased energy 0 0 1  Change in appetite 0 0 0  Feeling bad or failure about yourself  0 0 2  Trouble concentrating 0 0 2  Moving slowly or fidgety/restless 0 0 0  Suicidal thoughts 0 0 0  PHQ-9 Score 2 2 9   Difficult doing work/chores - - Somewhat difficult   GAD 7 : Generalized Anxiety Score 12/29/2018 09/22/2018 08/21/2018 05/18/2018  Nervous, Anxious, on Edge 2 0 2 1  Control/stop worrying 2 1 3  0   Worry too much - different things 2 1 3 1   Trouble relaxing 1 0 3 0  Restless 0 0 3 0  Easily annoyed or irritable 0 0 2 0  Afraid - awful might happen 1 0 2 0  Total GAD 7 Score 8 2 18 2   Anxiety Difficulty Not difficult at all Not difficult at all Somewhat difficult -   Relevant past medical, surgical, family and social history reviewed and updated as indicated. Interim medical history since our last visit reviewed. Allergies and medications reviewed and updated.  Review of Systems  Per HPI unless specifically indicated above     Objective:    There were no vitals taken for this visit.  Wt Readings from Last 3 Encounters:  10/23/18 143 lb 3 oz (64.9 kg)  09/22/18 143 lb 8 oz (65.1 kg)  08/21/18 145 lb 7 oz (66 kg)    Physical Exam Vitals signs and nursing note reviewed.  Constitutional:      General: She is not in acute distress.    Appearance: Normal appearance.  HENT:     Head: Atraumatic.     Right Ear: External ear normal.     Left Ear: External ear normal.     Nose: Nose normal. No congestion.     Mouth/Throat:     Mouth: Mucous membranes are moist.  Pharynx: Oropharynx is clear. No posterior oropharyngeal erythema.  Eyes:     Extraocular Movements: Extraocular movements intact.     Conjunctiva/sclera: Conjunctivae normal.  Neck:     Musculoskeletal: Normal range of motion.  Cardiovascular:     Comments: Unable to assess via virtual visit Pulmonary:     Effort: Pulmonary effort is normal. No respiratory distress.  Musculoskeletal: Normal range of motion.  Skin:    General: Skin is dry.     Findings: No erythema.  Neurological:     Mental Status: She is alert and oriented to person, place, and time.  Psychiatric:        Mood and Affect: Mood normal.        Thought Content: Thought content normal.        Judgment: Judgment normal.     Results for orders placed or performed in visit on 10/23/18  Veritor Flu A/B Waived  Result Value Ref Range    Influenza A Negative Negative   Influenza B Positive (A) Negative      Assessment & Plan:   Problem List Items Addressed This Visit      Other   Depression - Primary    Moods well controlled with prozac, continue current regimen      Relevant Medications   FLUoxetine (PROZAC) 40 MG capsule    Other Visit Diagnoses    Anxiety       Notes relief with hydroxyzine as needed but gets drowsy. Will try cutting in half. F/u if anxiety sxs worsening   Relevant Medications   FLUoxetine (PROZAC) 40 MG capsule       Follow up plan: Return in about 3 months (around 03/31/2019) for Anxiety f/u.

## 2019-03-30 ENCOUNTER — Encounter: Payer: Self-pay | Admitting: Family Medicine

## 2019-03-30 ENCOUNTER — Ambulatory Visit (INDEPENDENT_AMBULATORY_CARE_PROVIDER_SITE_OTHER): Payer: BC Managed Care – PPO | Admitting: Family Medicine

## 2019-03-30 ENCOUNTER — Encounter: Payer: BC Managed Care – PPO | Admitting: Family Medicine

## 2019-03-30 ENCOUNTER — Other Ambulatory Visit: Payer: Self-pay

## 2019-03-30 VITALS — BP 113/78 | HR 105 | Temp 97.8°F | Ht 61.0 in | Wt 152.0 lb

## 2019-03-30 DIAGNOSIS — F341 Dysthymic disorder: Secondary | ICD-10-CM

## 2019-03-30 DIAGNOSIS — Z0000 Encounter for general adult medical examination without abnormal findings: Secondary | ICD-10-CM | POA: Diagnosis not present

## 2019-03-30 NOTE — Progress Notes (Signed)
BP 113/78   Pulse (!) 105   Temp 97.8 F (36.6 C) (Oral)   Ht 5\' 1"  (1.549 m)   Wt 152 lb (68.9 kg)   SpO2 96%   BMI 28.72 kg/m    Subjective:    Patient ID: Carolyn Harrison, female    DOB: 03-May-1986, 33 y.o.   MRN: [id number]  HPI: Carolyn Harrison is a 33 y.o. female presenting on 03/30/2019 for comprehensive medical examination. Current medical complaints include:none  She currently lives with: Menopausal Symptoms: no  Depression Screen done today and results listed below:  Depression screen [facility] 2/9 03/30/2019 12/29/2018 09/22/2018 08/21/2018 05/18/2018  Decreased Interest 0 0 0 1 1  Down, Depressed, Hopeless 0 1 1 2  0  PHQ - 2 Score 0 1 1 3 1   Altered sleeping 2 1 1 1  0  Tired, decreased energy 0 0 0 1 0  Change in appetite 0 0 0 0 0  Feeling bad or failure about yourself  0 0 0 2 0  Trouble concentrating 0 0 0 2 0  Moving slowly or fidgety/restless 1 0 0 0 0  Suicidal thoughts 0 0 0 0 0  PHQ-9 Score 3 2 2 9 1   Difficult doing work/chores - - - Somewhat difficult -    The patient does not have a history of falls. I did not complete a risk assessment for falls. A plan of care for falls was not documented.   Past Medical History:  Past Medical History:  Diagnosis Date  . Breech presentation 08/15/2010  . History of Helicobacter pylori infection    2015  . History of Papanicolaou smear of cervix 07/09/2011; 03/13/15   NEG;ASCUS, HPV -;  . Rh(D) positive   . Spontaneous abortion 2010    Surgical History:  Past Surgical History:  Procedure Laterality Date  . CESAREAN SECTION  08/25/2010   BREECH PRESENTATION  . GANGLION CYST EXCISION Right 2011  . INTRAUTERINE DEVICE (IUD) INSERTION  07/12/2012  . TONSILLECTOMY  1994  . UPPER GI ENDOSCOPY  12/2013   SL INFLAMMATION OF STOMACH LINING TESTED POS FOR H PYLORI    Medications:  Current Outpatient Medications on File Prior to Visit  Medication Sig  . Fexofenadine HCl (ALLEGRA PO) Take by mouth daily.  .  hydrOXYzine (ATARAX/VISTARIL) 25 MG tablet Take 1 tablet (25 mg total) by mouth 3 (three) times daily as needed.  Marland Kitchen levonorgestrel (MIRENA) 20 MCG/24HR IUD 1 each by Intrauterine route once.   No current facility-administered medications on file prior to visit.     Allergies:  Allergies  Allergen Reactions  . Keflex [Cephalexin]     Social History:  Social History   Socioeconomic History  . Marital status: Married    Spouse name: Not on file  . Number of children: 3  . Years of education: 23  . Highest education level: Not on file  Occupational History  . Occupation: TEACHER    Comment: [employer]  Social Needs  . Financial resource strain: Not on file  . Food insecurity    Worry: Not on file    Inability: Not on file  . Transportation needs    Medical: Not on file    Non-medical: Not on file  Tobacco Use  . Smoking status: Never Smoker  . Smokeless tobacco: Never Used  Substance and Sexual Activity  . Alcohol use: No  . Drug use: No  . Sexual activity: Yes    Birth control/protection: I.U.D.  Lifestyle  .  Physical activity    Days per week: Not on file    Minutes per session: Not on file  . Stress: Not on file  Relationships  . Social Musicianconnections    Talks on phone: Not on file    Gets together: Not on file    Attends religious service: Not on file    Active member of club or organization: Not on file    Attends meetings of clubs or organizations: Not on file    Relationship status: Not on file  . Intimate partner violence    Fear of current or ex partner: Not on file    Emotionally abused: Not on file    Physically abused: Not on file    Forced sexual activity: Not on file  Other Topics Concern  . Not on file  Social History Narrative  . Not on file   Social History   Tobacco Use  Smoking Status Never Smoker  Smokeless Tobacco Never Used   Social History   Substance and Sexual Activity  Alcohol Use No    Family History:  Family History   Problem Relation Age of Onset  . Ulcers Mother        stomach  . Diabetes Maternal Grandmother        GESTATIONAL; TYPE 2  . Cancer Paternal Grandmother        LEUKEMIA    Past medical history, surgical history, medications, allergies, family history and social history reviewed with patient today and changes made to appropriate areas of the chart.   Review of Systems - General ROS: negative Psychological ROS: negative Ophthalmic ROS: negative ENT ROS: negative Allergy and Immunology ROS: negative Hematological and Lymphatic ROS: negative Endocrine ROS: negative Breast ROS: negative for breast lumps Respiratory ROS: no cough, shortness of breath, or wheezing Cardiovascular ROS: no chest pain or dyspnea on exertion Gastrointestinal ROS: no abdominal pain, change in bowel habits, or black or bloody stools Genito-Urinary ROS: no dysuria, trouble voiding, or hematuria Musculoskeletal ROS: negative Neurological ROS: no TIA or stroke symptoms Dermatological ROS: negative All other ROS negative except what is listed above and in the HPI.      Objective:    BP 113/78   Pulse (!) 105   Temp 97.8 F (36.6 C) (Oral)   Ht 5\' 1"  (1.549 m)   Wt 152 lb (68.9 kg)   SpO2 96%   BMI 28.72 kg/m   Wt Readings from Last 3 Encounters:  03/30/19 152 lb (68.9 kg)  10/23/18 143 lb 3 oz (64.9 kg)  09/22/18 143 lb 8 oz (65.1 kg)    Physical Exam Vitals signs and nursing note reviewed.  Constitutional:      General: She is not in acute distress.    Appearance: She is well-developed.  HENT:     Head: Atraumatic.     Right Ear: External ear normal.     Left Ear: External ear normal.     Nose: Nose normal.     Mouth/Throat:     Pharynx: No oropharyngeal exudate.  Eyes:     General: No scleral icterus.    Conjunctiva/sclera: Conjunctivae normal.     Pupils: Pupils are equal, round, and reactive to light.  Neck:     Musculoskeletal: Normal range of motion and neck supple.     Thyroid: No  thyromegaly.  Cardiovascular:     Rate and Rhythm: Normal rate and regular rhythm.     Heart sounds: Normal heart sounds.  Pulmonary:  Effort: Pulmonary effort is normal. No respiratory distress.     Breath sounds: Normal breath sounds.  Abdominal:     General: Bowel sounds are normal.     Palpations: Abdomen is soft. There is no mass.     Tenderness: There is no abdominal tenderness.  Genitourinary:    Comments: GU exam declined Musculoskeletal: Normal range of motion.        General: No tenderness.  Lymphadenopathy:     Cervical: No cervical adenopathy.  Skin:    General: Skin is warm and dry.     Findings: No rash.  Neurological:     Mental Status: She is alert and oriented to person, place, and time.     Cranial Nerves: No cranial nerve deficit.  Psychiatric:        Behavior: Behavior normal.     Results for orders placed or performed in visit on 03/30/19  CBC with Differential/Platelet  Result Value Ref Range   WBC 6.3 3.4 - 10.8 x10E3/uL   RBC 4.57 3.77 - 5.28 x10E6/uL   Hemoglobin 13.5 11.1 - 15.9 g/dL   Hematocrit 16.140.3 09.634.0 - 46.6 %   MCV 88 79 - 97 fL   MCH 29.5 26.6 - 33.0 pg   MCHC 33.5 31.5 - 35.7 g/dL   RDW 04.512.1 40.911.7 - 81.115.4 %   Platelets 195 150 - 450 x10E3/uL   Neutrophils 66 Not Estab. %   Lymphs 25 Not Estab. %   Monocytes 8 Not Estab. %   Eos 1 Not Estab. %   Basos 0 Not Estab. %   Neutrophils Absolute 4.2 1.4 - 7.0 x10E3/uL   Lymphocytes Absolute 1.6 0.7 - 3.1 x10E3/uL   Monocytes Absolute 0.5 0.1 - 0.9 x10E3/uL   EOS (ABSOLUTE) 0.1 0.0 - 0.4 x10E3/uL   Basophils Absolute 0.0 0.0 - 0.2 x10E3/uL   Immature Granulocytes 0 Not Estab. %   Immature Grans (Abs) 0.0 0.0 - 0.1 x10E3/uL  Comprehensive metabolic panel  Result Value Ref Range   Glucose 96 65 - 99 mg/dL   BUN 10 6 - 20 mg/dL   Creatinine, Ser 9.140.72 0.57 - 1.00 mg/dL   GFR calc non Af Amer 109 >59 mL/min/1.73   GFR calc Af Amer 125 >59 mL/min/1.73   BUN/Creatinine Ratio 14 9 - 23    Sodium 137 134 - 144 mmol/L   Potassium 4.1 3.5 - 5.2 mmol/L   Chloride 99 96 - 106 mmol/L   CO2 27 20 - 29 mmol/L   Calcium 9.7 8.7 - 10.2 mg/dL   Total Protein 6.8 6.0 - 8.5 g/dL   Albumin 4.6 3.8 - 4.8 g/dL   Globulin, Total 2.2 1.5 - 4.5 g/dL   Albumin/Globulin Ratio 2.1 1.2 - 2.2   Bilirubin Total 0.4 0.0 - 1.2 mg/dL   Alkaline Phosphatase 76 39 - 117 IU/L   AST 20 0 - 40 IU/L   ALT 17 0 - 32 IU/L  Lipid Panel w/o Chol/HDL Ratio  Result Value Ref Range   Cholesterol, Total 187 100 - 199 mg/dL   Triglycerides 44 0 - 149 mg/dL   HDL 74 >78>39 mg/dL   VLDL Cholesterol Cal 9 5 - 40 mg/dL   LDL Calculated 295104 (H) 0 - 99 mg/dL  UA/M w/rflx Culture, Routine   Specimen: Urine   URINE  Result Value Ref Range   Specific Gravity, UA 1.015 1.005 - 1.030   pH, UA 6.0 5.0 - 7.5   Color, UA Yellow Yellow  Appearance Ur Clear Clear   Leukocytes,UA Negative Negative   Protein,UA Negative Negative/Trace   Glucose, UA Negative Negative   Ketones, UA Negative Negative   RBC, UA Negative Negative   Bilirubin, UA Negative Negative   Urobilinogen, Ur 0.2 0.2 - 1.0 mg/dL   Nitrite, UA Negative Negative      Assessment & Plan:   Problem List Items Addressed This Visit      Other   Depression   Relevant Medications   FLUoxetine (PROZAC) 40 MG capsule    Other Visit Diagnoses    Annual physical exam    -  Primary   Relevant Orders   CBC with Differential/Platelet (Completed)   Comprehensive metabolic panel (Completed)   Lipid Panel w/o Chol/HDL Ratio (Completed)   UA/M w/rflx Culture, Routine (Completed)       Follow up plan: Return in about 1 year (around 03/29/2020) for CPE, mood f/u.   LABORATORY TESTING:  - Pap smear: up to date  IMMUNIZATIONS:   - Tdap: Tetanus vaccination status reviewed: last tetanus booster within 10 years. - Influenza: Postponed to flu season  PATIENT COUNSELING:   Advised to take 1 mg of folate supplement per day if capable of pregnancy.    Sexuality: Discussed sexually transmitted diseases, partner selection, use of condoms, avoidance of unintended pregnancy  and contraceptive alternatives.   Advised to avoid cigarette smoking.  I discussed with the patient that most people either abstain from alcohol or drink within safe limits (<=14/week and <=4 drinks/occasion for males, <=7/weeks and <= 3 drinks/occasion for females) and that the risk for alcohol disorders and other health effects rises proportionally with the number of drinks per week and how often a drinker exceeds daily limits.  Discussed cessation/primary prevention of drug use and availability of treatment for abuse.   Diet: Encouraged to adjust caloric intake to maintain  or achieve ideal body weight, to reduce intake of dietary saturated fat and total fat, to limit sodium intake by avoiding high sodium foods and not adding table salt, and to maintain adequate dietary potassium and calcium preferably from fresh fruits, vegetables, and low-fat dairy products.    stressed the importance of regular exercise  Injury prevention: Discussed safety belts, safety helmets, smoke detector, smoking near bedding or upholstery.   Dental health: Discussed importance of regular tooth brushing, flossing, and dental visits.    NEXT PREVENTATIVE PHYSICAL DUE IN 1 YEAR. Return in about 1 year (around 03/29/2020) for CPE, mood f/u.

## 2019-03-31 LAB — UA/M W/RFLX CULTURE, ROUTINE
Bilirubin, UA: NEGATIVE
Glucose, UA: NEGATIVE
Ketones, UA: NEGATIVE
Leukocytes,UA: NEGATIVE
Nitrite, UA: NEGATIVE
Protein,UA: NEGATIVE
RBC, UA: NEGATIVE
Specific Gravity, UA: 1.015 (ref 1.005–1.030)
Urobilinogen, Ur: 0.2 mg/dL (ref 0.2–1.0)
pH, UA: 6 (ref 5.0–7.5)

## 2019-03-31 LAB — CBC WITH DIFFERENTIAL/PLATELET
Basophils Absolute: 0 10*3/uL (ref 0.0–0.2)
Basos: 0 %
EOS (ABSOLUTE): 0.1 10*3/uL (ref 0.0–0.4)
Eos: 1 %
Hematocrit: 40.3 % (ref 34.0–46.6)
Hemoglobin: 13.5 g/dL (ref 11.1–15.9)
Immature Grans (Abs): 0 10*3/uL (ref 0.0–0.1)
Immature Granulocytes: 0 %
Lymphocytes Absolute: 1.6 10*3/uL (ref 0.7–3.1)
Lymphs: 25 %
MCH: 29.5 pg (ref 26.6–33.0)
MCHC: 33.5 g/dL (ref 31.5–35.7)
MCV: 88 fL (ref 79–97)
Monocytes Absolute: 0.5 10*3/uL (ref 0.1–0.9)
Monocytes: 8 %
Neutrophils Absolute: 4.2 10*3/uL (ref 1.4–7.0)
Neutrophils: 66 %
Platelets: 195 10*3/uL (ref 150–450)
RBC: 4.57 x10E6/uL (ref 3.77–5.28)
RDW: 12.1 % (ref 11.7–15.4)
WBC: 6.3 10*3/uL (ref 3.4–10.8)

## 2019-03-31 LAB — COMPREHENSIVE METABOLIC PANEL
ALT: 17 IU/L (ref 0–32)
AST: 20 IU/L (ref 0–40)
Albumin/Globulin Ratio: 2.1 (ref 1.2–2.2)
Albumin: 4.6 g/dL (ref 3.8–4.8)
Alkaline Phosphatase: 76 IU/L (ref 39–117)
BUN/Creatinine Ratio: 14 (ref 9–23)
BUN: 10 mg/dL (ref 6–20)
Bilirubin Total: 0.4 mg/dL (ref 0.0–1.2)
CO2: 27 mmol/L (ref 20–29)
Calcium: 9.7 mg/dL (ref 8.7–10.2)
Chloride: 99 mmol/L (ref 96–106)
Creatinine, Ser: 0.72 mg/dL (ref 0.57–1.00)
GFR calc Af Amer: 125 mL/min/{1.73_m2} (ref >59)
GFR calc non Af Amer: 109 mL/min/{1.73_m2} (ref >59)
Globulin, Total: 2.2 g/dL (ref 1.5–4.5)
Glucose: 96 mg/dL (ref 65–99)
Potassium: 4.1 mmol/L (ref 3.5–5.2)
Sodium: 137 mmol/L (ref 134–144)
Total Protein: 6.8 g/dL (ref 6.0–8.5)

## 2019-03-31 LAB — LIPID PANEL W/O CHOL/HDL RATIO
Cholesterol, Total: 187 mg/dL (ref 100–199)
HDL: 74 mg/dL (ref >39)
LDL Calculated: 104 mg/dL — ABNORMAL HIGH (ref 0–99)
Triglycerides: 44 mg/dL (ref 0–149)
VLDL Cholesterol Cal: 9 mg/dL (ref 5–40)

## 2019-04-02 MED ORDER — FLUOXETINE HCL 40 MG PO CAPS: 40.0000 mg | ORAL_CAPSULE | Freq: Every day | ORAL | 3 refills | Status: DC

## 2020-04-02 ENCOUNTER — Encounter: Payer: BC Managed Care – PPO | Admitting: Family Medicine

## 2020-04-04 ENCOUNTER — Encounter: Payer: Self-pay | Admitting: Family Medicine

## 2020-04-04 ENCOUNTER — Ambulatory Visit (INDEPENDENT_AMBULATORY_CARE_PROVIDER_SITE_OTHER): Payer: BC Managed Care – PPO | Admitting: Family Medicine

## 2020-04-04 ENCOUNTER — Other Ambulatory Visit: Payer: Self-pay

## 2020-04-04 VITALS — BP 107/71 | HR 73 | Temp 98.2°F | Ht 61.6 in | Wt 165.2 lb

## 2020-04-04 DIAGNOSIS — Z0000 Encounter for general adult medical examination without abnormal findings: Secondary | ICD-10-CM

## 2020-04-04 DIAGNOSIS — Z136 Encounter for screening for cardiovascular disorders: Secondary | ICD-10-CM | POA: Diagnosis not present

## 2020-04-04 DIAGNOSIS — F341 Dysthymic disorder: Secondary | ICD-10-CM | POA: Diagnosis not present

## 2020-04-04 NOTE — Progress Notes (Signed)
BP 107/71   Pulse 73   Temp 98.2 F (36.8 C) (Oral)   Ht 5' 1.6" (1.565 m)   Wt 165 lb 3.2 oz (74.9 kg)   SpO2 98%   BMI 30.61 kg/m    Subjective:    Patient ID: Carolyn Harrison, female    DOB: 1986/03/05, 34 y.o.   MRN: [id number]  HPI: Carolyn Harrison is a 34 y.o. female presenting on 04/04/2020 for comprehensive medical examination. Current medical complaints include:none  Taking prozac daily for depression which has been working very well for her. Rarely ever takes the hydroxyzine, states it makes her groggy. Denies Si/HI.   She currently lives with: Menopausal Symptoms: no  Depression Screen done today and results listed below:  Depression screen [facility] 2/9 04/04/2020 03/30/2019 12/29/2018 09/22/2018 08/21/2018  Decreased Interest 0 0 0 0 1  Down, Depressed, Hopeless 1 0 1 1 2   PHQ - 2 Score 1 0 1 1 3   Altered sleeping 1 2 1 1 1   Tired, decreased energy 1 0 0 0 1  Change in appetite 0 0 0 0 0  Feeling bad or failure about yourself  1 0 0 0 2  Trouble concentrating 0 0 0 0 2  Moving slowly or fidgety/restless 0 1 0 0 0  Suicidal thoughts 0 0 0 0 0  PHQ-9 Score 4 3 2 2 9   Difficult doing work/chores - - - - Somewhat difficult    The patient does not have a history of falls. I did complete a risk assessment for falls. A plan of care for falls was documented.   Past Medical History:  Past Medical History:  Diagnosis Date  . Breech presentation 08/15/2010  . History of Helicobacter pylori infection    2015  . History of Papanicolaou smear of cervix 07/09/2011; 03/13/15   NEG;ASCUS, HPV -;  . Rh(D) positive   . Spontaneous abortion 2010    Surgical History:  Past Surgical History:  Procedure Laterality Date  . CESAREAN SECTION  08/25/2010   BREECH PRESENTATION  . GANGLION CYST EXCISION Right 2011  . INTRAUTERINE DEVICE (IUD) INSERTION  07/12/2012  . TONSILLECTOMY  1994  . UPPER GI ENDOSCOPY  12/2013   SL INFLAMMATION OF STOMACH LINING TESTED POS FOR H PYLORI     Medications:  Current Outpatient Medications on File Prior to Visit  Medication Sig  . Fexofenadine HCl (ALLEGRA PO) Take by mouth daily.  08/27/2010 FLUoxetine (PROZAC) 40 MG capsule Take 1 capsule (40 mg total) by mouth daily.  . hydrOXYzine (ATARAX/VISTARIL) 25 MG tablet Take 1 tablet (25 mg total) by mouth 3 (three) times daily as needed.  2012 levonorgestrel (MIRENA) 20 MCG/24HR IUD 1 each by Intrauterine route once.   No current facility-administered medications on file prior to visit.    Allergies:  Allergies  Allergen Reactions  . Keflex [Cephalexin]     Social History:  Social History   Socioeconomic History  . Marital status: Married    Spouse name: Not on file  . Number of children: 3  . Years of education: 20  . Highest education level: Not on file  Occupational History  . Occupation: TEACHER    Comment: [employer]  Tobacco Use  . Smoking status: Never Smoker  . Smokeless tobacco: Never Used  Vaping Use  . Vaping Use: Never used  Substance and Sexual Activity  . Alcohol use: No  . Drug use: No  . Sexual activity: Yes    Birth control/protection:  I.U.D.  Other Topics Concern  . Not on file  Social History Narrative  . Not on file   Social Determinants of Health   Financial Resource Strain:   . Difficulty of Paying Living Expenses:   Food Insecurity:   . Worried About Programme researcher, broadcasting/film/video in the Last Year:   . Barista in the Last Year:   Transportation Needs:   . Freight forwarder (Medical):   Marland Kitchen Lack of Transportation (Non-Medical):   Physical Activity:   . Days of Exercise per Week:   . Minutes of Exercise per Session:   Stress:   . Feeling of Stress :   Social Connections:   . Frequency of Communication with Friends and Family:   . Frequency of Social Gatherings with Friends and Family:   . Attends Religious Services:   . Active Member of Clubs or Organizations:   . Attends Banker Meetings:   Marland Kitchen Marital Status:    Intimate Partner Violence:   . Fear of Current or Ex-Partner:   . Emotionally Abused:   Marland Kitchen Physically Abused:   . Sexually Abused:    Social History   Tobacco Use  Smoking Status Never Smoker  Smokeless Tobacco Never Used   Social History   Substance and Sexual Activity  Alcohol Use No    Family History:  Family History  Problem Relation Age of Onset  . Ulcers Mother        stomach  . Diabetes Maternal Grandmother        GESTATIONAL; TYPE 2  . Cancer Paternal Grandmother        LEUKEMIA    Past medical history, surgical history, medications, allergies, family history and social history reviewed with patient today and changes made to appropriate areas of the chart.   Review of Systems - General ROS: negative Psychological ROS: negative Ophthalmic ROS: negative ENT ROS: negative Allergy and Immunology ROS: negative Hematological and Lymphatic ROS: negative Endocrine ROS: negative Breast ROS: negative for breast lumps Respiratory ROS: no cough, shortness of breath, or wheezing Cardiovascular ROS: no chest pain or dyspnea on exertion Gastrointestinal ROS: no abdominal pain, change in bowel habits, or black or bloody stools Genito-Urinary ROS: no dysuria, trouble voiding, or hematuria Musculoskeletal ROS: negative Neurological ROS: no TIA or stroke symptoms Dermatological ROS: negative All other ROS negative except what is listed above and in the HPI.      Objective:    BP 107/71   Pulse 73   Temp 98.2 F (36.8 C) (Oral)   Ht 5' 1.6" (1.565 m)   Wt 165 lb 3.2 oz (74.9 kg)   SpO2 98%   BMI 30.61 kg/m   Wt Readings from Last 3 Encounters:  04/04/20 165 lb 3.2 oz (74.9 kg)  03/30/19 152 lb (68.9 kg)  10/23/18 143 lb 3 oz (64.9 kg)    Physical Exam Vitals and nursing note reviewed.  Constitutional:      General: She is not in acute distress.    Appearance: She is well-developed.  HENT:     Head: Atraumatic.     Right Ear: External ear normal.     Left  Ear: External ear normal.     Nose: Nose normal.     Mouth/Throat:     Pharynx: No oropharyngeal exudate.  Eyes:     General: No scleral icterus.    Conjunctiva/sclera: Conjunctivae normal.     Pupils: Pupils are equal, round, and reactive to light.  Neck:     Thyroid: No thyromegaly.  Cardiovascular:     Rate and Rhythm: Normal rate and regular rhythm.     Heart sounds: Normal heart sounds.  Pulmonary:     Effort: Pulmonary effort is normal. No respiratory distress.     Breath sounds: Normal breath sounds.  Chest:     Comments: Breast exam done through GYN Abdominal:     General: Bowel sounds are normal.     Palpations: Abdomen is soft. There is no mass.     Tenderness: There is no abdominal tenderness.  Genitourinary:    Comments: GU exam performed through GYN Musculoskeletal:        General: No tenderness. Normal range of motion.     Cervical back: Normal range of motion and neck supple.  Lymphadenopathy:     Cervical: No cervical adenopathy.  Skin:    General: Skin is warm and dry.     Findings: No rash.  Neurological:     Mental Status: She is alert and oriented to person, place, and time.     Cranial Nerves: No cranial nerve deficit.  Psychiatric:        Behavior: Behavior normal.     Results for orders placed or performed in visit on 04/04/20  Microscopic Examination   Urine  Result Value Ref Range   WBC, UA 6-10 (A) 0 - 5 /hpf   RBC None seen 0 - 2 /hpf   Epithelial Cells (non renal) 0-10 0 - 10 /hpf   Crystals Present N/A   Crystal Type Amorphous Sediment N/A   Mucus, UA Present Not Estab.   Bacteria, UA Few (A) None seen/Few  Urine Culture, Reflex   Urine  Result Value Ref Range   Urine Culture, Routine WILL FOLLOW   UA/M w/rflx Culture, Routine   Specimen: Urine   Urine  Result Value Ref Range   Specific Gravity, UA 1.020 1.005 - 1.030   pH, UA 7.0 5.0 - 7.5   Color, UA Yellow Yellow   Appearance Ur Hazy (A) Clear   Leukocytes,UA 2+ (A)  Negative   Protein,UA Trace (A) Negative/Trace   Glucose, UA Negative Negative   Ketones, UA Negative Negative   RBC, UA Negative Negative   Bilirubin, UA Negative Negative   Urobilinogen, Ur 2.0 (H) 0.2 - 1.0 mg/dL   Nitrite, UA Negative Negative   Microscopic Examination See below:    Urinalysis Reflex Comment       Assessment & Plan:   Problem List Items Addressed This Visit      Other   Depression - Primary    Chronic, stable and well controlled, continue current regimen       Other Visit Diagnoses    Annual physical exam       Relevant Orders   CBC with Differential/Platelet   Lipid Panel w/o Chol/HDL Ratio   TSH   UA/M w/rflx Culture, Routine (Completed)   Screening for cardiovascular condition       Relevant Orders   Comprehensive metabolic panel       Follow up plan: Return in about 1 year (around 04/04/2021) for CPE.   LABORATORY TESTING:  - Pap smear: up to date  IMMUNIZATIONS:   - Tdap: Tetanus vaccination status reviewed: last tetanus booster within 10 years. - Influenza: Up to date  PATIENT COUNSELING:   Advised to take 1 mg of folate supplement per day if capable of pregnancy.    Sexuality: Discussed sexually transmitted diseases, partner  selection, use of condoms, avoidance of unintended pregnancy  and contraceptive alternatives.   Advised to avoid cigarette smoking.  I discussed with the patient that most people either abstain from alcohol or drink within safe limits (<=14/week and <=4 drinks/occasion for males, <=7/weeks and <= 3 drinks/occasion for females) and that the risk for alcohol disorders and other health effects rises proportionally with the number of drinks per week and how often a drinker exceeds daily limits.  Discussed cessation/primary prevention of drug use and availability of treatment for abuse.   Diet: Encouraged to adjust caloric intake to maintain  or achieve ideal body weight, to reduce intake of dietary saturated fat and  total fat, to limit sodium intake by avoiding high sodium foods and not adding table salt, and to maintain adequate dietary potassium and calcium preferably from fresh fruits, vegetables, and low-fat dairy products.    stressed the importance of regular exercise  Injury prevention: Discussed safety belts, safety helmets, smoke detector, smoking near bedding or upholstery.   Dental health: Discussed importance of regular tooth brushing, flossing, and dental visits.    NEXT PREVENTATIVE PHYSICAL DUE IN 1 YEAR. Return in about 1 year (around 04/04/2021) for CPE.

## 2020-04-04 NOTE — Assessment & Plan Note (Signed)
Chronic, stable and well controlled, continue current regimen

## 2020-04-05 LAB — CBC WITH DIFFERENTIAL/PLATELET
Basophils Absolute: 0 10*3/uL (ref 0.0–0.2)
Basos: 0 %
EOS (ABSOLUTE): 0.1 10*3/uL (ref 0.0–0.4)
Eos: 2 %
Hematocrit: 39 % (ref 34.0–46.6)
Hemoglobin: 13.1 g/dL (ref 11.1–15.9)
Immature Grans (Abs): 0 10*3/uL (ref 0.0–0.1)
Immature Granulocytes: 0 %
Lymphocytes Absolute: 1.4 10*3/uL (ref 0.7–3.1)
Lymphs: 26 %
MCH: 29.4 pg (ref 26.6–33.0)
MCHC: 33.6 g/dL (ref 31.5–35.7)
MCV: 88 fL (ref 79–97)
Monocytes Absolute: 0.4 10*3/uL (ref 0.1–0.9)
Monocytes: 8 %
Neutrophils Absolute: 3.4 10*3/uL (ref 1.4–7.0)
Neutrophils: 64 %
Platelets: 187 10*3/uL (ref 150–450)
RBC: 4.45 x10E6/uL (ref 3.77–5.28)
RDW: 11.9 % (ref 11.7–15.4)
WBC: 5.3 10*3/uL (ref 3.4–10.8)

## 2020-04-05 LAB — COMPREHENSIVE METABOLIC PANEL
ALT: 15 IU/L (ref 0–32)
AST: 17 IU/L (ref 0–40)
Albumin/Globulin Ratio: 1.8 (ref 1.2–2.2)
Albumin: 4.2 g/dL (ref 3.8–4.8)
Alkaline Phosphatase: 88 IU/L (ref 48–121)
BUN/Creatinine Ratio: 12 (ref 9–23)
BUN: 11 mg/dL (ref 6–20)
Bilirubin Total: 0.3 mg/dL (ref 0.0–1.2)
CO2: 26 mmol/L (ref 20–29)
Calcium: 8.9 mg/dL (ref 8.7–10.2)
Chloride: 102 mmol/L (ref 96–106)
Creatinine, Ser: 0.92 mg/dL (ref 0.57–1.00)
GFR calc Af Amer: 93 mL/min/{1.73_m2} (ref >59)
GFR calc non Af Amer: 80 mL/min/{1.73_m2} (ref >59)
Globulin, Total: 2.4 g/dL (ref 1.5–4.5)
Glucose: 90 mg/dL (ref 65–99)
Potassium: 4 mmol/L (ref 3.5–5.2)
Sodium: 138 mmol/L (ref 134–144)
Total Protein: 6.6 g/dL (ref 6.0–8.5)

## 2020-04-05 LAB — LIPID PANEL W/O CHOL/HDL RATIO
Cholesterol, Total: 197 mg/dL (ref 100–199)
HDL: 61 mg/dL (ref >39)
LDL Chol Calc (NIH): 121 mg/dL — ABNORMAL HIGH (ref 0–99)
Triglycerides: 84 mg/dL (ref 0–149)
VLDL Cholesterol Cal: 15 mg/dL (ref 5–40)

## 2020-04-05 LAB — TSH: TSH: 1.21 u[IU]/mL (ref 0.450–4.500)

## 2020-04-08 LAB — UA/M W/RFLX CULTURE, ROUTINE
Bilirubin, UA: NEGATIVE
Glucose, UA: NEGATIVE
Ketones, UA: NEGATIVE
Nitrite, UA: NEGATIVE
RBC, UA: NEGATIVE
Specific Gravity, UA: 1.02 (ref 1.005–1.030)
Urobilinogen, Ur: 2 mg/dL — ABNORMAL HIGH (ref 0.2–1.0)
pH, UA: 7 (ref 5.0–7.5)

## 2020-04-08 LAB — URINE CULTURE, REFLEX

## 2020-04-08 LAB — MICROSCOPIC EXAMINATION: RBC: NONE SEEN /hpf (ref 0–2)

## 2020-04-23 ENCOUNTER — Other Ambulatory Visit: Payer: Self-pay

## 2020-04-23 ENCOUNTER — Encounter: Payer: Self-pay | Admitting: Advanced Practice Midwife

## 2020-04-23 ENCOUNTER — Ambulatory Visit: Payer: BC Managed Care – PPO | Admitting: Advanced Practice Midwife

## 2020-04-23 VITALS — BP 115/56 | Wt 163.0 lb

## 2020-04-23 DIAGNOSIS — Z30432 Encounter for removal of intrauterine contraceptive device: Secondary | ICD-10-CM | POA: Diagnosis not present

## 2020-04-23 NOTE — Progress Notes (Signed)
    GYNECOLOGY OFFICE PROCEDURE NOTE  Carolyn Harrison is a 34 y.o. [id number] here for IUD removal. The patient currently has a Mirena IUD placed 5 years ago. She and her husband are planning conception in the near future. She has had no concerns with the current IUD. Last pap smear was on 10/12/2017 and was normal. We reviewed pre-conception planning/recommendations.   Review of Systems  Constitutional: Negative for chills and fever.  HENT: Negative for congestion, ear discharge, ear pain, hearing loss, sinus pain and sore throat.   Eyes: Negative for blurred vision and double vision.  Respiratory: Negative for cough, shortness of breath and wheezing.   Cardiovascular: Negative for chest pain, palpitations and leg swelling.  Gastrointestinal: Negative for abdominal pain, blood in stool, constipation, diarrhea, heartburn, melena, nausea and vomiting.  Genitourinary: Negative for dysuria, flank pain, frequency, hematuria and urgency.  Musculoskeletal: Negative for back pain, joint pain and myalgias.  Skin: Negative for itching and rash.  Neurological: Negative for dizziness, tingling, tremors, sensory change, speech change, focal weakness, seizures, loss of consciousness, weakness and headaches.  Endo/Heme/Allergies: Negative for environmental allergies. Does not bruise/bleed easily.  Psychiatric/Behavioral: Negative for depression, hallucinations, memory loss, substance abuse and suicidal ideas. The patient is not nervous/anxious and does not have insomnia.    Vital Signs: BP (!) 115/56   Wt 163 lb (73.9 kg)   BMI 30.20 kg/m  Constitutional: Well nourished, well developed female in no acute distress.  HEENT: normal Skin: Warm and dry.  Respiratory:  Normal respiratory effort Neuro: DTRs 2+, Cranial nerves grossly intact Psych: Alert and Oriented x3. No memory deficits. Normal mood and affect.  MS: normal gait, normal bilateral lower extremity ROM/strength/stability.  Pelvic exam:  is not  limited by body habitus EGBUS: within normal limits Vagina: within normal limits and with normal mucosa  Cervix: normal appearance   IUD Removal and Reinsertion  Patient identified, informed consent performed, consent signed.   Discussed risks of irregular bleeding, cramping, infection, malpositioning or uterine perforation of the IUD which may require further procedures. Time out was performed. Speculum placed in the vagina. The strings of the IUD were grasped and pulled using curved forceps. The IUD was successfully removed in its entirety.  Patient tolerated procedure well.     Tresea Mall, CNM [facility], [facility]   IUD remval [id number]

## 2020-05-22 ENCOUNTER — Encounter: Payer: Self-pay | Admitting: Family Medicine

## 2020-07-17 ENCOUNTER — Other Ambulatory Visit: Payer: Self-pay

## 2020-07-17 ENCOUNTER — Ambulatory Visit: Payer: BC Managed Care – PPO | Admitting: Obstetrics and Gynecology

## 2020-07-17 ENCOUNTER — Encounter: Payer: Self-pay | Admitting: Obstetrics and Gynecology

## 2020-07-17 VITALS — BP 116/71 | Ht 61.0 in | Wt 167.4 lb

## 2020-07-17 DIAGNOSIS — N926 Irregular menstruation, unspecified: Secondary | ICD-10-CM | POA: Diagnosis not present

## 2020-07-17 MED ORDER — MEDROXYPROGESTERONE ACETATE 10 MG PO TABS: 10.0000 mg | ORAL_TABLET | Freq: Every day | ORAL | 0 refills | Status: DC

## 2020-07-17 NOTE — Progress Notes (Signed)
Obstetrics & Gynecology Office Visit   Chief Complaint  Patient presents with  . Amenorrhea   History of Present Illness: 34 y.o. [id number] female who presents for being about a week late on her cycle.  She took home pregnancy tests which were negative.  She is having tenderness in her breasts and darkness in the nipple area.  She had her IUD removed on 04/23/20. Since that time she has been pretty regular with her cycles.  Her LMP was 06/14/2020.  Since her IUD has been removed she has had between a 25 and 27 day cycle.  The last home pregnancy test was this morning and was negative.  Her breast tenderness is more than usual and has been present for 3-4 days.  This is unusual for her with her cycles.    Past Medical History:  Diagnosis Date  . Breech presentation 08/15/2010  . History of Helicobacter pylori infection    2015  . History of Papanicolaou smear of cervix 07/09/2011; 03/13/15   NEG;ASCUS, HPV -;  . Rh(D) positive   . Spontaneous abortion 2010    Past Surgical History:  Procedure Laterality Date  . CESAREAN SECTION  08/25/2010   BREECH PRESENTATION  . GANGLION CYST EXCISION Right 2011  . INTRAUTERINE DEVICE (IUD) INSERTION  07/12/2012  . TONSILLECTOMY  1994  . UPPER GI ENDOSCOPY  12/2013   SL INFLAMMATION OF STOMACH LINING TESTED POS FOR H PYLORI   Gynecologic History: Patient's last menstrual period was 06/14/2020 (exact date).  Obstetric History: [id number]  Family History  Problem Relation Age of Onset  . Ulcers Mother        stomach  . Diabetes Maternal Grandmother        GESTATIONAL; TYPE 2  . Cancer Paternal Grandmother        LEUKEMIA    Social History   Socioeconomic History  . Marital status: Married    Spouse name: Not on file  . Number of children: 3  . Years of education: 87  . Highest education level: Not on file  Occupational History  . Occupation: TEACHER    Comment: [employer]  Tobacco Use  . Smoking status: Never Smoker  . Smokeless  tobacco: Never Used  Vaping Use  . Vaping Use: Never used  Substance and Sexual Activity  . Alcohol use: No  . Drug use: No  . Sexual activity: Yes    Birth control/protection: None  Other Topics Concern  . Not on file  Social History Narrative  . Not on file   Social Determinants of Health   Financial Resource Strain:   . Difficulty of Paying Living Expenses: Not on file  Food Insecurity:   . Worried About Programme researcher, broadcasting/film/video in the Last Year: Not on file  . Ran Out of Food in the Last Year: Not on file  Transportation Needs:   . Lack of Transportation (Medical): Not on file  . Lack of Transportation (Non-Medical): Not on file  Physical Activity:   . Days of Exercise per Week: Not on file  . Minutes of Exercise per Session: Not on file  Stress:   . Feeling of Stress : Not on file  Social Connections:   . Frequency of Communication with Friends and Family: Not on file  . Frequency of Social Gatherings with Friends and Family: Not on file  . Attends Religious Services: Not on file  . Active Member of Clubs or Organizations: Not on file  . Attends Banker  Meetings: Not on file  . Marital Status: Not on file  Intimate Partner Violence:   . Fear of Current or Ex-Partner: Not on file  . Emotionally Abused: Not on file  . Physically Abused: Not on file  . Sexually Abused: Not on file    Allergies  Allergen Reactions  . Keflex [Cephalexin]     Prior to Admission medications   Medication Sig Start Date End Date Taking? Authorizing Provider  Cetirizine HCl (ZYRTEC PO) Take by mouth.   Yes [provider]  Fexofenadine HCl (ALLEGRA PO) Take by mouth daily.    [provider]    Review of Systems  Constitutional: Negative.   HENT: Negative.   Eyes: Negative.   Respiratory: Negative.   Cardiovascular: Negative.   Gastrointestinal: Negative.   Genitourinary: Negative.   Musculoskeletal: Negative.   Skin: Negative.   Neurological:  Negative.   Psychiatric/Behavioral: Negative.      Physical Exam BP 116/71   Ht 5\' 1"  (1.549 m)   Wt 167 lb 6.4 oz (75.9 kg)   LMP 06/14/2020 (Exact Date)   BMI 31.63 kg/m  Patient's last menstrual period was 06/14/2020 (exact date). Physical Exam Constitutional:      General: She is not in acute distress.    Appearance: Normal appearance.  HENT:     Head: Normocephalic and atraumatic.  Eyes:     General: No scleral icterus.    Conjunctiva/sclera: Conjunctivae normal.  Neurological:     General: No focal deficit present.     Mental Status: She is alert and oriented to person, place, and time.     Cranial Nerves: No cranial nerve deficit.  Psychiatric:        Mood and Affect: Mood normal.        Behavior: Behavior normal.        Judgment: Judgment normal.     Female chaperone present for pelvic and breast  portions of the physical exam  Assessment: 34 y.o. 31 female here for  1. Missed menses     Plan: Problem List Items Addressed This Visit    None    Visit Diagnoses    Missed menses    -  Primary   Relevant Medications   medroxyPROGESTERone (PROVERA) 10 MG tablet   Other Relevant Orders   Beta hCG quant (ref lab)   Prolactin     Will obtain a quantitative hCG level. If negative, will force menses after discussing with patient.  Discussed normal menstrual pattern and question of regular ovulation. We will await the results of the tests today. Will continue formulating a plan, as needed to help her affect menstruation. Since she would like to conceive, will discuss any other interventions that might be warranted.   A total of 25 minutes were spent face-to-face with the patient as well as preparation, review, communication, and documentation during this encounter.    [id number], MD 07/17/2020 9:25 AM

## 2020-07-18 LAB — BETA HCG QUANT (REF LAB): hCG Quant: <1 m[IU]/mL

## 2020-07-18 LAB — PROLACTIN: Prolactin: 8.4 ng/mL (ref 4.8–23.3)

## 2020-09-25 ENCOUNTER — Other Ambulatory Visit: Payer: Self-pay

## 2020-09-25 ENCOUNTER — Encounter: Payer: Self-pay | Admitting: Advanced Practice Midwife

## 2020-09-25 ENCOUNTER — Other Ambulatory Visit (HOSPITAL_COMMUNITY)
Admission: RE | Admit: 2020-09-25 | Discharge: 2020-09-25 | Disposition: A | Discharge status: Home / Self Care | Payer: BC Managed Care – PPO | Source: Ambulatory Visit | Attending: Advanced Practice Midwife | Admitting: Advanced Practice Midwife

## 2020-09-25 ENCOUNTER — Ambulatory Visit (INDEPENDENT_AMBULATORY_CARE_PROVIDER_SITE_OTHER): Payer: BC Managed Care – PPO | Admitting: Advanced Practice Midwife

## 2020-09-25 VITALS — BP 110/80 | Wt 167.0 lb

## 2020-09-25 DIAGNOSIS — O0991 Supervision of high risk pregnancy, unspecified, first trimester: Secondary | ICD-10-CM

## 2020-09-25 DIAGNOSIS — Z113 Encounter for screening for infections with a predominantly sexual mode of transmission: Secondary | ICD-10-CM | POA: Diagnosis present

## 2020-09-25 DIAGNOSIS — O09523 Supervision of elderly multigravida, third trimester: Secondary | ICD-10-CM | POA: Insufficient documentation

## 2020-09-25 DIAGNOSIS — N912 Amenorrhea, unspecified: Secondary | ICD-10-CM | POA: Diagnosis not present

## 2020-09-25 DIAGNOSIS — O0993 Supervision of high risk pregnancy, unspecified, third trimester: Secondary | ICD-10-CM | POA: Insufficient documentation

## 2020-09-25 DIAGNOSIS — Z3A01 Less than 8 weeks gestation of pregnancy: Secondary | ICD-10-CM

## 2020-09-25 DIAGNOSIS — O09521 Supervision of elderly multigravida, first trimester: Secondary | ICD-10-CM

## 2020-09-25 LAB — POCT URINE PREGNANCY: Preg Test, Ur: POSITIVE — AB

## 2020-09-25 NOTE — Patient Instructions (Signed)
Exercise During Pregnancy Exercise is an important part of being healthy for people of all ages. Exercise improves the function of your heart and lungs and helps you maintain strength, flexibility, and a healthy body weight. Exercise also boosts energy levels and elevates mood. Most women should exercise regularly during pregnancy. In rare cases, women with certain medical conditions or complications may be asked to limit or avoid exercise during pregnancy. How does this affect me? Along with maintaining general strength and flexibility, exercising during pregnancy can help:  Keep strength in muscles that are used during labor and childbirth.  Decrease low back pain.  Reduce symptoms of depression.  Control weight gain during pregnancy.  Reduce the risk of needing insulin if you develop diabetes during pregnancy.  Decrease the risk of cesarean delivery.  Speed up your recovery after giving birth. How does this affect my baby? Exercise can help you have a healthy pregnancy. Exercise does not cause premature birth. It will not cause your baby to weigh less at birth. What exercises can I do? Many exercises are safe for you to do during pregnancy. Do a variety of exercises that safely increase your heart and breathing rates and help you build and maintain muscle strength. Do exercises exactly as told by your health care provider. You may do these exercises:  Walking or hiking.  Swimming.  Water aerobics.  Riding a stationary bike.  Strength training.  Modified yoga or Pilates. Tell your instructor that you are pregnant. Avoid overstretching, and avoid lying on your back for long periods of time.  Running or jogging. Only choose this type of exercise if you: ? Ran or jogged regularly before your pregnancy. ? Can run or jog and still talk in complete sentences. What exercises should I avoid? Depending on your level of fitness and whether you exercised regularly before your  pregnancy, you may be told to limit high-intensity exercise. You can tell that you are exercising at a high intensity if you are breathing much harder and faster and cannot hold a conversation while exercising. You must avoid:  Contact sports.  Activities that put you at risk for falling on or being hit in the belly, such as downhill skiing, water skiing, surfing, rock climbing, cycling, gymnastics, and horseback riding.  Scuba diving.  Skydiving.  Yoga or Pilates in a room that is heated to high temperatures.  Jogging or running, unless you ran or jogged regularly before your pregnancy. While jogging or running, you should always be able to talk in full sentences. Do not run or jog so fast that you are unable to have a conversation.  Do not exercise at more than 6,000 feet above sea level (high elevation) if you are not used to exercising at high elevation. How do I exercise in a safe way?   Avoid overheating. Do not exercise in very high temperatures.  Wear loose-fitting, breathable clothes.  Avoid dehydration. Drink enough water before, during, and after exercise to keep your urine pale yellow.  Avoid overstretching. Because of hormone changes during pregnancy, it is easy to overstretch muscles, tendons, and ligaments during pregnancy.  Start slowly and ask your health care provider to recommend the types of exercise that are safe for you.  Do not exercise to lose weight. Follow these instructions at home:  Exercise on most days or all days of the week. Try to exercise for 30 minutes a day, 5 days a week, unless your health care provider tells you not to.  If   you actively exercised before your pregnancy and you are healthy, your health care provider may tell you to continue to do moderate to high-intensity exercise.  If you are just starting to exercise or did not exercise much before your pregnancy, your health care provider may tell you to do low to moderate-intensity  exercise. Questions to ask your health care provider  Is exercise safe for me?  What are signs that I should stop exercising?  Does my health condition mean that I should not exercise during pregnancy?  When should I avoid exercising during pregnancy? Stop exercising and contact a health care provider if: You have any unusual symptoms, such as:  Mild contractions of the uterus or cramps in the abdomen.  Dizziness that does not go away when you rest. Stop exercising and get help right away if: You have any unusual symptoms, such as:  Sudden, severe pain in your low back or your belly.  Mild contractions of the uterus or cramps in the abdomen that do not improve with rest and drinking fluids.  Chest pain.  Bleeding or fluid leaking from your vagina.  Shortness of breath. These symptoms may represent a serious problem that is an emergency. Do not wait to see if the symptoms will go away. Get medical help right away. Call your local emergency services (911 in the [place] Do not drive yourself to the hospital. Summary  Most women should exercise regularly throughout pregnancy. In rare cases, women with certain medical conditions or complications may be asked to limit or avoid exercise during pregnancy.  Do not exercise to lose weight during pregnancy.  Your health care provider will tell you what level of physical activity is right for you.  Stop exercising and contact a health care provider if you have mild contractions of the uterus or cramps in the abdomen. Get help right away if these contractions or cramps do not improve with rest and drinking fluids.  Stop exercising and get help right away if you have sudden, severe pain in your low back or belly, chest pain, shortness of breath, or bleeding or leaking of fluid from your vagina. This information is not intended to replace advice given to you by your health care provider. Make sure you discuss any questions you have with your  health care provider. Document Revised: 01/11/2019 Document Reviewed: 10/25/2018 Elsevier Patient Education  2020 [employer] Eating Plan for Pregnant Women While you are pregnant, your body requires additional nutrition to help support your growing baby. You also have a higher need for some vitamins and minerals, such as folic acid, calcium, iron, and vitamin D. Eating a healthy, well-balanced diet is very important for your health and your baby's health. Your need for extra calories varies for the three 3-month segments of your pregnancy (trimesters). For most women, it is recommended to consume:  150 extra calories a day during the first trimester.  300 extra calories a day during the second trimester.  300 extra calories a day during the third trimester. What are tips for following this plan?   Do not try to lose weight or go on a diet during pregnancy.  Limit your overall intake of foods that have "empty calories." These are foods that have little nutritional value, such as sweets, desserts, candies, and sugar-sweetened beverages.  Eat a variety of foods (especially fruits and vegetables) to get a full range of vitamins and minerals.  Take a prenatal vitamin to help meet your additional vitamin and mineral needs   during pregnancy, specifically for folic acid, iron, calcium, and vitamin D.  Remember to stay active. Ask your health care provider what types of exercise and activities are safe for you.  Practice good food safety and cleanliness. Wash your hands before you eat and after you prepare raw meat. Wash all fruits and vegetables well before peeling or eating. Taking these actions can help to prevent food-borne illnesses that can be very dangerous to your baby, such as listeriosis. Ask your health care provider for more information about listeriosis. What does 150 extra calories look like? Healthy options that provide 150 extra calories each day could be any of the  following:  6-8 oz (170-230 g) of plain low-fat yogurt with  cup of berries.  1 apple with 2 teaspoons (11 g) of peanut butter.  Cut-up vegetables with  cup (60 g) of hummus.  8 oz (230 mL) or 1 cup of low-fat chocolate milk.  1 stick of string cheese with 1 medium orange.  1 peanut butter and jelly sandwich that is made with one slice of whole-wheat bread and 1 tsp (5 g) of peanut butter. For 300 extra calories, you could eat two of those healthy options each day. What is a healthy amount of weight to gain? The right amount of weight gain for you is based on your BMI before you became pregnant. If your BMI:  Was less than 18 (underweight), you should gain 28-40 lb (13-18 kg).  Was 18-24.9 (normal), you should gain 25-35 lb (11-16 kg).  Was 25-29.9 (overweight), you should gain 15-25 lb (7-11 kg).  Was 30 or greater (obese), you should gain 11-20 lb (5-9 kg). What if I am having twins or multiples? Generally, if you are carrying twins or multiples:  You may need to eat 300-600 extra calories a day.  The recommended range for total weight gain is 25-54 lb (11-25 kg), depending on your BMI before pregnancy.  Talk with your health care provider to find out about nutritional needs, weight gain, and exercise that is right for you. What foods can I eat?  Fruits All fruits. Eat a variety of colors and types of fruit. Remember to wash your fruits well before peeling or eating. Vegetables All vegetables. Eat a variety of colors and types of vegetables. Remember to wash your vegetables well before peeling or eating. Grains All grains. Choose whole grains, such as whole-wheat bread, oatmeal, or brown rice. Meats and other protein foods Lean meats, including chicken, turkey, fish, and lean cuts of beef, veal, or pork. If you eat fish or seafood, choose options that are higher in omega-3 fatty acids and lower in mercury, such as salmon, herring, mussels, trout, sardines, pollock,  shrimp, crab, and lobster. Tofu. Tempeh. Beans. Eggs. Peanut butter and other nut butters. Make sure that all meats, poultry, and eggs are cooked to food-safe temperatures or "well-done." Two or more servings of fish are recommended each week in order to get the most benefits from omega-3 fatty acids that are found in seafood. Choose fish that are lower in mercury. You can find more information online:  www.fda.gov Dairy Pasteurized milk and milk alternatives (such as almond milk). Pasteurized yogurt and pasteurized cheese. Cottage cheese. Sour cream. Beverages Water. Juices that contain 100% fruit juice or vegetable juice. Caffeine-free teas and decaffeinated coffee. Drinks that contain caffeine are okay to drink, but it is better to avoid caffeine. Keep your total caffeine intake to less than 200 mg each day (which is 12 oz   or 355 mL of coffee, tea, or soda) or the limit as told by your health care provider. Fats and oils Fats and oils are okay to include in moderation. Sweets and desserts Sweets and desserts are okay to include in moderation. Seasoning and other foods All pasteurized condiments. The items listed above may not be a complete list of foods and beverages you can eat. Contact a dietitian for more information. What foods are not recommended? Fruits Unpasteurized fruit juices. Vegetables Raw (unpasteurized) vegetable juices. Meats and other protein foods Lunch meats, bologna, hot dogs, or other deli meats. (If you must eat those meats, reheat them until they are steaming hot.) Refrigerated pat, meat spreads from a meat counter, smoked seafood that is found in the refrigerated section of a store. Raw or undercooked meats, poultry, and eggs. Raw fish, such as sushi or sashimi. Fish that have high mercury content, such as tilefish, shark, swordfish, and king mackerel. To learn more about mercury in fish, talk with your health care provider or look for online resources, such  as:  www.fda.gov Dairy Raw (unpasteurized) milk and any foods that have raw milk in them. Soft cheeses, such as feta, queso blanco, queso fresco, Brie, Camembert cheeses, blue-veined cheeses, and Panela cheese (unless it is made with pasteurized milk, which must be stated on the label). Beverages Alcohol. Sugar-sweetened beverages, such as sodas, teas, or energy drinks. Seasoning and other foods Homemade fermented foods and drinks, such as pickles, sauerkraut, or kombucha drinks. (Store-bought pasteurized versions of these are okay.) Salads that are made in a store or deli, such as ham salad, chicken salad, egg salad, tuna salad, and seafood salad. The items listed above may not be a complete list of foods and beverages you should avoid. Contact a dietitian for more information. Where to find more information To calculate the number of calories you need based on your height, weight, and activity level, you can use an online calculator such as:  www.choosemyplate.gov/MyPlatePlan To calculate how much weight you should gain during pregnancy, you can use an online pregnancy weight gain calculator such as:  www.choosemyplate.gov/pregnancy-weight-gain-calculator Summary  While you are pregnant, your body requires additional nutrition to help support your growing baby.  Eat a variety of foods, especially fruits and vegetables to get a full range of vitamins and minerals.  Practice good food safety and cleanliness. Wash your hands before you eat and after you prepare raw meat. Wash all fruits and vegetables well before peeling or eating. Taking these actions can help to prevent food-borne illnesses, such as listeriosis, that can be very dangerous to your baby.  Do not eat raw meat or fish. Do not eat fish that have high mercury content, such as tilefish, shark, swordfish, and king mackerel. Do not eat unpasteurized (raw) dairy.  Take a prenatal vitamin to help meet your additional vitamin and  mineral needs during pregnancy, specifically for folic acid, iron, calcium, and vitamin D. This information is not intended to replace advice given to you by your health care provider. Make sure you discuss any questions you have with your health care provider. Document Revised: 02/08/2019 Document Reviewed: 06/17/2017 Elsevier Patient Education  2020 [employer] Prenatal Care Prenatal care is health care during pregnancy. It helps you and your unborn baby (fetus) stay as healthy as possible. Prenatal care may be provided by a midwife, a family practice health care provider, or a childbirth and pregnancy specialist (obstetrician). How does this affect me? During pregnancy, you will be closely monitored   for any new conditions that might develop. To lower your risk of pregnancy complications, you and your health care provider will talk about any underlying conditions you have. How does this affect my baby? Early and consistent prenatal care increases the chance that your baby will be healthy during pregnancy. Prenatal care lowers the risk that your baby will be:  Born early (prematurely).  Smaller than expected at birth (small for gestational age). What can I expect at the first prenatal care visit? Your first prenatal care visit will likely be the longest. You should schedule your first prenatal care visit as soon as you know that you are pregnant. Your first visit is a good time to talk about any questions or concerns you have about pregnancy. At your visit, you and your health care provider will talk about:  Your medical history, including: ? Any past pregnancies. ? Your family's medical history. ? The baby's father's medical history. ? Any long-term (chronic) health conditions you have and how you manage them. ? Any surgeries or procedures you have had. ? Any current over-the-counter or prescription medicines, herbs, or supplements you are taking.  Other factors that could pose a risk  to your baby, including:  Your home setting and your stress levels, including: ? Exposure to abuse or violence. ? Household financial strain. ? Mental health conditions you have.  Your daily health habits, including diet and exercise. Your health care provider will also:  Measure your weight, height, and blood pressure.  Do a physical exam, including a pelvic and breast exam.  Perform blood tests and urine tests to check for: ? Urinary tract infection. ? Sexually transmitted infections (STIs). ? Low iron levels in your blood (anemia). ? Blood type and certain proteins on red blood cells (Rh antibodies). ? Infections and immunity to viruses, such as hepatitis B and rubella. ? HIV (human immunodeficiency virus).  Do an ultrasound to confirm your baby's growth and development and to help predict your estimated due date (EDD). This ultrasound is done with a probe that is inserted into the vagina (transvaginal ultrasound).  Discuss your options for genetic screening.  Give you information about how to keep yourself and your baby healthy, including: ? Nutrition and taking vitamins. ? Physical activity. ? How to manage pregnancy symptoms such as nausea and vomiting (morning sickness). ? Infections and substances that may be harmful to your baby and how to avoid them. ? Food safety. ? Dental care. ? Working. ? Travel. ? Warning signs to watch for and when to call your health care provider. How often will I have prenatal care visits? After your first prenatal care visit, you will have regular visits throughout your pregnancy. The visit schedule is often as follows:  Up to week 28 of pregnancy: once every 4 weeks.  28-36 weeks: once every 2 weeks.  After 36 weeks: every week until delivery. Some women may have visits more or less often depending on any underlying health conditions and the health of the baby. Keep all follow-up and prenatal care visits as told by your health care  provider. This is important. What happens during routine prenatal care visits? Your health care provider will:  Measure your weight and blood pressure.  Check for fetal heart sounds.  Measure the height of your uterus in your abdomen (fundal height). This may be measured starting around week 20 of pregnancy.  Check the position of your baby inside your uterus.  Ask questions about your diet, sleeping patterns, and   whether you can feel the baby move.  Review warning signs to watch for and signs of labor.  Ask about any pregnancy symptoms you are having and how you are dealing with them. Symptoms may include: ? Headaches. ? Nausea and vomiting. ? Vaginal discharge. ? Swelling. ? Fatigue. ? Constipation. ? Any discomfort, including back or pelvic pain. Make a list of questions to ask your health care provider at your routine visits. What tests might I have during prenatal care visits? You may have blood, urine, and imaging tests throughout your pregnancy, such as:  Urine tests to check for glucose, protein, or signs of infection.  Glucose tests to check for a form of diabetes that can develop during pregnancy (gestational diabetes mellitus). This is usually done around week 24 of pregnancy.  An ultrasound to check your baby's growth and development and to check for birth defects. This is usually done around week 20 of pregnancy.  A test to check for group B strep (GBS) infection. This is usually done around week 36 of pregnancy.  Genetic testing. This may include blood or imaging tests, such as an ultrasound. Some genetic tests are done during the first trimester and some are done during the second trimester. What else can I expect during prenatal care visits? Your health care provider may recommend getting certain vaccines during pregnancy. These may include:  A yearly flu shot (annual influenza vaccine). This is especially important if you will be pregnant during flu  season.  Tdap (tetanus, diphtheria, pertussis) vaccine. Getting this vaccine during pregnancy can protect your baby from whooping cough (pertussis) after birth. This vaccine may be recommended between weeks 27 and 36 of pregnancy. Later in your pregnancy, your health care provider may give you information about:  Childbirth and breastfeeding classes.  Choosing a health care provider for your baby.  Umbilical cord banking.  Breastfeeding.  Birth control after your baby is born.  The hospital labor and delivery unit and how to tour it.  Registering at the hospital before you go into labor. Where to find more information  Office on Women's Health: womenshealth.gov  American Pregnancy Association: americanpregnancy.org  March of Dimes: marchofdimes.org Summary  Prenatal care helps you and your baby stay as healthy as possible during pregnancy.  Your first prenatal care visit will most likely be the longest.  You will have visits and tests throughout your pregnancy to monitor your health and your baby's health.  Bring a list of questions to your visits to ask your health care provider.  Make sure to keep all follow-up and prenatal care visits with your health care provider. This information is not intended to replace advice given to you by your health care provider. Make sure you discuss any questions you have with your health care provider. Document Revised: 01/10/2019 Document Reviewed: 09/19/2017 Elsevier Patient Education  2020 [employer]  

## 2020-09-25 NOTE — Progress Notes (Signed)
New Obstetric Patient H&P    Chief Complaint: "Desires prenatal care"   History of Present Illness: Patient is a 34 y.o. [id number] Not Hispanic or Latino female, presents with amenorrhea and positive home pregnancy test. Patient's last menstrual period was 08/15/2020 (exact date). and based on her  LMP, her EDD is Estimated Date of Delivery: 05/22/21 and her EGA is 3680w6d. Cycles are 3-5 days, regular, and occur approximately every : 28 days. Her last pap smear was 2 or 3 years ago and was no abnormalities.    She had a urine pregnancy test which was positive 2 week(s)  ago. Her last menstrual period was normal and lasted for  3 or 4 day(s). Since her LMP she claims she has experienced breast tenderness, fatigue, nausea. She denies vaginal bleeding. Her past medical history is noncontributory. Her prior pregnancies are notable for G1 SAB 2010, G2 c/s breech 39 weeks 7# female, G3 VBAC 36 weeks 5 days 7# 2oz female (PPROM).  Since her LMP, she admits to the use of tobacco products  no She claims she has gained  a few pounds since the start of her pregnancy.  There are cats in the home in the home  no  She admits close contact with children on a regular basis  yes  She has had chicken pox in the past yes She has had Tuberculosis exposures, symptoms, or previously tested positive for TB   no Current or past history of domestic violence. no  Genetic Screening/Teratology Counseling: (Includes patient, baby's father, or anyone in either family with:)   1. [facility]= 4535 [facility]  yes 2. Thalassemia ([place], [place], Mediterranean, or Asian background): MCV<80  no 3. Neural tube defect (meningomyelocele, spina bifida, anencephaly)  no 4. Congenital heart defect  no  5. Down syndrome  no 6. Tay-Sachs (Jewish, [place])  no 7. Canavan's Disease  no 8. Sickle cell disease or trait (African)  no  9. Hemophilia or other blood disorders  no  10. Muscular dystrophy  no  11. Cystic fibrosis  no   12. Huntington's Chorea  no  13. Mental retardation/autism  no 14. Other inherited genetic or chromosomal disorder  FOB niece with Rett S. 15. Maternal metabolic disorder (DM, PKU, etc)  no 16. Patient or FOB with a child with a birth defect not listed above no  16a. Patient or FOB with a birth defect themselves no 17. Recurrent pregnancy loss, or stillbirth  no  18. Any medications since LMP other than prenatal vitamins (include vitamins, supplements, OTC meds, drugs, alcohol)  no 19. Any other genetic/environmental exposure to discuss  no  Infection History:   1. Lives with someone with TB or TB exposed  no  2. Patient or partner has history of genital herpes  no 3. Rash or viral illness since LMP  no 4. History of STI (GC, CT, HPV, syphilis, HIV)  no 5. History of recent travel :  no  Other pertinent information:  no     Review of Systems:10 point review of systems negative unless otherwise noted in HPI  Past Medical History:  Patient Active Problem List   Diagnosis Date Noted  . Supervision of high risk pregnancy in first trimester 09/25/2020    Clinic [facility] Prenatal Labs  Dating  Blood type:     Genetic Screen 1 Screen:    AFP:     Quad:     NIPS: Antibody:   Anatomic US  Rubella:  Varicella: @VZVIGG @  GTT Early: Hgb A1C               Third trimester:  RPR:     Rhogam  HBsAg:     Vaccines TDAP:                       Flu Shot: Covid: HIV:     Baby Food breast                               GBS:   GC/CT:  Contraception  Pap: 2019 negative  CBB   AMA  CS/VBAC G2 in 2011 (breech), G3 VBAC 2016   Support Person 2017       . Multigravida of advanced maternal age in first trimester 09/25/2020  . Depression 08/21/2018  . Breast pain, right 10/12/2017    Past Surgical History:  Past Surgical History:  Procedure Laterality Date  . CESAREAN SECTION  08/25/2010   BREECH PRESENTATION  . GANGLION CYST EXCISION Right 2011  . INTRAUTERINE DEVICE (IUD) INSERTION   07/12/2012  . TONSILLECTOMY  1994  . UPPER GI ENDOSCOPY  12/2013   SL INFLAMMATION OF STOMACH LINING TESTED POS FOR H PYLORI    Gynecologic History: Patient's last menstrual period was 08/15/2020 (exact date).  Obstetric History: 13/09/2020  Family History:  Family History  Problem Relation Age of Onset  . Ulcers Mother        stomach  . Diabetes Maternal Grandmother        GESTATIONAL; TYPE 2  . Cancer Paternal Grandmother        LEUKEMIA    Social History:  Social History   Socioeconomic History  . Marital status: Married    Spouse name: Not on file  . Number of children: 3  . Years of education: 82  . Highest education level: Not on file  Occupational History  . Occupation: TEACHER    Comment: [employer]  Tobacco Use  . Smoking status: Never Smoker  . Smokeless tobacco: Never Used  Vaping Use  . Vaping Use: Never used  Substance and Sexual Activity  . Alcohol use: No  . Drug use: No  . Sexual activity: Yes    Partners: Male    Birth control/protection: None  Other Topics Concern  . Not on file  Social History Narrative  . Not on file   Social Determinants of Health   Financial Resource Strain: Not on file  Food Insecurity: Not on file  Transportation Needs: Not on file  Physical Activity: Not on file  Stress: Not on file  Social Connections: Not on file  Intimate Partner Violence: Not on file    Allergies:  Allergies  Allergen Reactions  . Keflex [Cephalexin]     Medications: Prior to Admission medications   Medication Sig Start Date End Date Taking? Authorizing Provider  Cetirizine HCl (ZYRTEC PO) Take by mouth.   Yes [provider]  FLUoxetine (PROZAC) 40 MG capsule Take 1 capsule (40 mg total) by mouth daily. Patient not taking: No sig reported 04/02/19   04/04/19, PA-C  hydrOXYzine (ATARAX/VISTARIL) 25 MG tablet Take 1 tablet (25 mg total) by mouth 3 (three) times daily as needed. Patient not taking: No sig  reported 08/21/18   08/23/18, PA-C    Physical Exam Vitals: Blood pressure 110/80, weight 167 lb (75.8 kg), last menstrual period 08/15/2020.  General: NAD HEENT:  normocephalic, anicteric Thyroid: no enlargement, no palpable nodules Pulmonary: No increased work of breathing, CTAB Cardiovascular: RRR, distal pulses 2+ Abdomen: NABS, soft, non-tender, non-distended.  Umbilicus without lesions.  No hepatomegaly, splenomegaly or masses palpable. No evidence of hernia  Genitourinary:  External: Normal external female genitalia.  Normal urethral meatus, normal Bartholin's and Skene's glands.    Vagina: Normal vaginal mucosa, no evidence of prolapse. Aptima swab collected    Cervix: not evaluated  Uterus: deferred    Adnexa: deferred  Rectal: deferred Extremities: no edema, erythema, or tenderness Neurologic: Grossly intact Psychiatric: mood appropriate, affect full  The following were addressed during this visit:  Breastfeeding Education - Early initiation of breastfeeding    Comments: Keeps milk supply adequate, helps contract uterus and slow bleeding, and early milk is the perfect first food and is easy to digest.   - The importance of exclusive breastfeeding    Comments: Provides antibodies, Lower risk of breast and ovarian cancers, and type-2 diabetes,Helps your body recover, Reduced chance of SIDS.   - Risks of giving your baby anything other than breast milk if you are breastfeeding    Comments: Make the baby less content with breastfeeds, may make my baby more susceptible to illness, and may reduce my milk supply.   - The importance of early skin-to-skin contact    Comments: Keeps baby warm and secure, helps keep baby's blood sugar up and breathing steady, easier to bond and breastfeed, and helps calm baby.  - Rooming-in on a 24-hour basis    Comments: Easier to learn baby's feeding cues, easier to bond and get to know each other, and encourages milk  production.   - Feeding on demand or baby-led feeding    Comments: Helps prevent breastfeeding complications, helps bring in good milk supply, prevents under or overfeeding, and helps baby feel content and satisfied   - Frequent feeding to help assure optimal milk production    Comments: Making a full supply of milk requires frequent removal of milk from breasts, infant will eat 8-12 times in 24 hours, if separated from infant use breast massage, hand expression and/ or pumping to remove milk from breasts.   - Effective positioning and attachment    Comments: Helps my baby to get enough breast milk, helps to produce an adequate milk supply, and helps prevent nipple pain and damage   - Exclusive breastfeeding for the first 6 months    Comments: Builds a healthy milk supply and keeps it up, protects baby from sickness and disease, and breastmilk has everything your baby needs for the first 6 months.  - Individualized Education    Comments: Contraindications to breastfeeding and other special medical conditions Patient has experience with breastfeeding- breastfed both her children for 1 year each     Assessment: 34 y.o. [id number] at [redacted]w[redacted]d presenting to initiate prenatal care  Plan: 1) Avoid alcoholic beverages. 2) Patient encouraged not to smoke.  3) Discontinue the use of all non-medicinal drugs and chemicals.  4) Take prenatal vitamins daily.  5) Nutrition, food safety (fish, cheese advisories, and high nitrite foods) and exercise discussed. 6) Hospital and practice style discussed with cross coverage system.  7) Genetic Screening, such as with 1st Trimester Screening, cell free fetal DNA, AFP testing, and Ultrasound, as well as with amniocentesis and CVS as appropriate, is discussed with patient. At the conclusion of today's visit patient requested genetic testing 8) Patient is asked about travel to areas at risk for the Zika virus, and  counseled to avoid travel and exposure to  mosquitoes or sexual partners who may have themselves been exposed to the virus. Testing is discussed, and will be ordered as appropriate.  9) Urine culture, aptima today 10) Return to clinic in 1 week for dating scan and rob 11) NOB panel, Hgb A1C, MaterniT 21 at 10+ weeks   Tresea Mall, CNM [facility] 09/25/2020, 5:07 PM

## 2020-09-25 NOTE — Progress Notes (Signed)
NOB- numbing/pain on right side of abdomen x 3 weeks on/off

## 2020-09-28 LAB — URINE CULTURE

## 2020-09-30 LAB — CERVICOVAGINAL ANCILLARY ONLY
Chlamydia: NEGATIVE
Comment: NEGATIVE
Comment: NEGATIVE
Comment: NORMAL
Neisseria Gonorrhea: NEGATIVE
Trichomonas: NEGATIVE

## 2020-10-10 ENCOUNTER — Other Ambulatory Visit: Payer: Self-pay

## 2020-10-10 ENCOUNTER — Ambulatory Visit (INDEPENDENT_AMBULATORY_CARE_PROVIDER_SITE_OTHER): Payer: Self-pay

## 2020-10-10 ENCOUNTER — Encounter: Payer: Self-pay | Admitting: Obstetrics and Gynecology

## 2020-10-10 ENCOUNTER — Ambulatory Visit (INDEPENDENT_AMBULATORY_CARE_PROVIDER_SITE_OTHER): Payer: BC Managed Care – PPO | Admitting: Obstetrics and Gynecology

## 2020-10-10 ENCOUNTER — Other Ambulatory Visit: Payer: Self-pay | Admitting: Advanced Practice Midwife

## 2020-10-10 VITALS — BP 124/78 | HR 74 | Wt 165.0 lb

## 2020-10-10 DIAGNOSIS — Z3A08 8 weeks gestation of pregnancy: Secondary | ICD-10-CM

## 2020-10-10 DIAGNOSIS — O0991 Supervision of high risk pregnancy, unspecified, first trimester: Secondary | ICD-10-CM

## 2020-10-10 DIAGNOSIS — O09899 Supervision of other high risk pregnancies, unspecified trimester: Secondary | ICD-10-CM | POA: Insufficient documentation

## 2020-10-10 DIAGNOSIS — O09521 Supervision of elderly multigravida, first trimester: Secondary | ICD-10-CM

## 2020-10-10 DIAGNOSIS — Z1379 Encounter for other screening for genetic and chromosomal anomalies: Secondary | ICD-10-CM

## 2020-10-10 NOTE — Progress Notes (Signed)
Routine Prenatal Care Visit  Subjective  Carolyn Harrison is a 35 y.o. [id number] at [redacted]w[redacted]d being seen today for ongoing prenatal care.  She is currently monitored for the following issues for this high-risk pregnancy and has Breast pain, right; Depression; Supervision of high risk pregnancy in first trimester; Multigravida of advanced maternal age in first trimester; and History of preterm delivery, currently pregnant on their problem list.  ----------------------------------------------------------------------------------- Patient reports no complaints.    . Vag. Bleeding: None,Scant,Other.   . Leaking Fluid denies.  U/S confirms EDD ----------------------------------------------------------------------------------- The following portions of the patient's history were reviewed and updated as appropriate: allergies, current medications, past family history, past medical history, past social history, past surgical history and problem list. Problem list updated.  Objective  Blood pressure 124/78, pulse 74, weight 165 lb (74.8 kg), last menstrual period 08/15/2020. Pregravid weight 165 lb (74.8 kg) Total Weight Gain 0 lb (0 kg) Urinalysis: Urine Protein    Urine Glucose    Fetal Status: Fetal Heart Rate (bpm): 169 ([place])         General:  Alert, oriented and cooperative. Patient is in no acute distress.  Skin: Skin is warm and dry. No rash noted.   Cardiovascular: Normal heart rate noted  Respiratory: Normal respiratory effort, no problems with respiration noted  Abdomen: Soft, gravid, appropriate for gestational age. Pain/Pressure: Absent     Pelvic:  Cervical exam deferred        Extremities: Normal range of motion.  Edema: None  Mental Status: Normal mood and affect. Normal behavior. Normal judgment and thought content.   Imaging Results US OB Transvaginal  Result Date: 10/10/2020 Patient Name: Carolyn Harrison DOB: 04/07/1986 MRN: [id number] ULTRASOUND REPORT Location: [facility] Date of  Service: 10/10/2020 Indications:dating Findings: Mason Jim intrauterine pregnancy is visualized with a CRL consistent with [redacted]w[redacted]d gestation, giving an (U/S) EDD of 05/20/2021. The (U/S) EDD is consistent with the clinically established EDD of 05/22/2021. FHR: 169 BPM CRL measurement: 18.0 mm Yolk sac is visualized and appears normal. Amnion: visualized and appears normal Right Ovary is normal in appearance. Left Ovary is normal appearance. Corpus luteal cyst:  Right ovary Survey of the adnexa demonstrates no adnexal masses. There is no free peritoneal fluid in the cul de sac. Impression: 1. [redacted]w[redacted]d Viable Singleton Intrauterine pregnancy by U/S. 2. (U/S) EDD is consistent with Clinically established EDD of 05/22/2021, [redacted]w[redacted]d. Deanna Artis, RT There is a viable singleton gestation.  Detailed evaluation of the fetal anatomy is precluded by early gestational age.  It must be noted that a normal ultrasound particular at this early gestational age is unable to rule out fetal aneuploidy, risk of first trimester miscarriage, or anatomic birth defects. Thomasene Mohair, MD, [facility], [facility] 10/10/2020 4:35 PM      Assessment   34 y.o. [id number] at [redacted]w[redacted]d by  05/22/2021, by Last Menstrual Period presenting for routine prenatal visit  Plan   FOURTH Problems (from 09/25/20 to present)    Problem Noted Resolved   History of preterm delivery, currently pregnant 10/10/2020 by Conard Novak, MD No   Supervision of high risk pregnancy in first trimester 09/25/2020 by Tresea Mall, CNM No   Overview Signed 09/25/2020  5:03 PM by Tresea Mall, CNM    [facility]inic [facility] Prenatal Labs  Dating  Blood type:     Genetic Screen 1 Screen:    AFP:     Quad:     NIPS: Antibody:   Anatomic [place]  Rubella:    Varicella: @VZVIGG @  GTT Early: Hgb A1C               Third trimester:  RPR:     Rhogam  HBsAg:     Vaccines TDAP:                       Flu Shot: Covid: HIV:     Baby Food breast                                GBS:   GC/CT:  Contraception  Pap: 2019 negative  CBB   AMA  CS/VBAC G2 in 2011 (breech), G3 VBAC 2016   Support Person [name]              Preterm labor symptoms and general obstetric precautions including but not limited to vaginal bleeding, contractions, leaking of fluid and fetal movement were reviewed in detail with the patient. Please refer to After Visit Summary for other counseling recommendations.   Return in about 2 weeks (around 10/24/2020) for 2 weeks for labs with MaterniT21 and 4 weeks Routine Prenatal Appointment.   10/26/2020, MD, [facility], [facility] 10/10/2020 5:11 PM

## 2020-10-24 ENCOUNTER — Other Ambulatory Visit: Payer: Self-pay

## 2020-10-24 ENCOUNTER — Other Ambulatory Visit: Payer: BC Managed Care – PPO

## 2020-10-24 DIAGNOSIS — O09899 Supervision of other high risk pregnancies, unspecified trimester: Secondary | ICD-10-CM

## 2020-10-24 DIAGNOSIS — O09521 Supervision of elderly multigravida, first trimester: Secondary | ICD-10-CM

## 2020-10-24 DIAGNOSIS — O0991 Supervision of high risk pregnancy, unspecified, first trimester: Secondary | ICD-10-CM

## 2020-10-27 LAB — RPR+RH+ABO+RUB AB+AB SCR+CB...
Antibody Screen: NEGATIVE
HIV Screen 4th Generation wRfx: NONREACTIVE
Hematocrit: 38.6 % (ref 34.0–46.6)
Hemoglobin: 12.6 g/dL (ref 11.1–15.9)
Hepatitis B Surface Ag: NEGATIVE
MCH: 29.2 pg (ref 26.6–33.0)
MCHC: 32.6 g/dL (ref 31.5–35.7)
MCV: 89 fL (ref 79–97)
Platelets: 204 10*3/uL (ref 150–450)
RBC: 4.32 x10E6/uL (ref 3.77–5.28)
RDW: 12.1 % (ref 11.7–15.4)
RPR Ser Ql: NONREACTIVE
Rh Factor: POSITIVE
Rubella Antibodies, IGG: 18 index (ref >0.99)
Varicella zoster IgG: 439 index (ref >165)
WBC: 6.9 10*3/uL (ref 3.4–10.8)

## 2020-10-27 LAB — HGB A1C W/O EAG: Hgb A1c MFr Bld: 5.3 % (ref 4.8–5.6)

## 2020-10-30 LAB — MATERNIT 21 PLUS CORE, BLOOD
Fetal Fraction: 7
Result (T21): NEGATIVE
Trisomy 13 (Patau syndrome): NEGATIVE
Trisomy 18 (Edwards syndrome): NEGATIVE
Trisomy 21 (Down syndrome): NEGATIVE

## 2020-11-07 ENCOUNTER — Other Ambulatory Visit: Payer: Self-pay

## 2020-11-07 ENCOUNTER — Ambulatory Visit (INDEPENDENT_AMBULATORY_CARE_PROVIDER_SITE_OTHER): Payer: BC Managed Care – PPO | Admitting: Obstetrics and Gynecology

## 2020-11-07 DIAGNOSIS — Z3A12 12 weeks gestation of pregnancy: Secondary | ICD-10-CM

## 2020-11-07 DIAGNOSIS — O99341 Other mental disorders complicating pregnancy, first trimester: Secondary | ICD-10-CM

## 2020-11-07 DIAGNOSIS — O09891 Supervision of other high risk pregnancies, first trimester: Secondary | ICD-10-CM

## 2020-11-07 DIAGNOSIS — F32A Depression, unspecified: Secondary | ICD-10-CM

## 2020-11-07 DIAGNOSIS — Z8759 Personal history of other complications of pregnancy, childbirth and the puerperium: Secondary | ICD-10-CM

## 2020-11-07 DIAGNOSIS — O0991 Supervision of high risk pregnancy, unspecified, first trimester: Secondary | ICD-10-CM

## 2020-11-07 DIAGNOSIS — O09899 Supervision of other high risk pregnancies, unspecified trimester: Secondary | ICD-10-CM

## 2020-11-07 DIAGNOSIS — O09521 Supervision of elderly multigravida, first trimester: Secondary | ICD-10-CM

## 2020-11-07 NOTE — Progress Notes (Signed)
gI connected with Carolyn Harrison on 11/07/20 at  3:50 PM EST by telephone and verified that I am speaking with the correct person using two identifiers.   I discussed the limitations, risks, security and privacy concerns of performing an evaluation and management service by telephone and the availability of in person appointments. I also discussed with the patient that there may be a patient responsible charge related to this service. The patient expressed understanding and agreed to proceed.  The patient was at home. I spoke with the patient from my workstation phone. The names of people involved in this encounter were: Carolyn Harrison , and Carolyn Harrison   Routine Prenatal Care Visit  Subjective  Carolyn Harrison is a 35 y.o. [id number] at [redacted]w[redacted]d being seen today for ongoing prenatal care.  She is currently monitored for the following issues for this low-risk pregnancy and has Breast pain, right; Depression; Supervision of high risk pregnancy in first trimester; Multigravida of advanced maternal age in first trimester; and History of preterm delivery, currently pregnant on their problem list.  ----------------------------------------------------------------------------------- Patient reports no complaints.  Having URI symptoms awaiting results of COVID testing Contractions: Not present. Vag. Bleeding: None.  Movement: Absent. Denies leaking of fluid.  ----------------------------------------------------------------------------------- The following portions of the patient's history were reviewed and updated as appropriate: allergies, current medications, past family history, past medical history, past social history, past surgical history and problem list. Problem list updated.   Objective  Last menstrual period 08/15/2020. Pregravid weight 165 lb (74.8 kg) Total Weight Gain 0 lb (0 kg) Urinalysis:      Fetal Status:     Movement: Absent     No physical exam as this was a remote  telephone visit to promote social distancing during the current COVID-19 Pandemic    Assessment   35 y.o. [id number] at [redacted]w[redacted]d by  05/22/2021, by Last Menstrual Period presenting for routine prenatal visit  Plan   FOURTH Problems (from 09/25/20 to present)    Problem Noted Resolved   History of preterm delivery, currently pregnant 10/10/2020 by Conard Novak, MD No   Supervision of high risk pregnancy in first trimester 09/25/2020 by Tresea Mall, CNM No   Overview Signed 09/25/2020  5:03 PM by Tresea Mall, CNM    [facility]inic [facility] Prenatal Labs  Dating LMP = 8 week [place] Blood type: O positive  Genetic Screen NIPS: normal XY Antibody:negative  Anatomic [place]  Rubella:Immune Varicella:Immune  GTT Early: Hgb A1C               Third trimester:  RPR:NR  Rhogam  HBsAg: negative   Vaccines TDAP:                       Flu Shot: Covid: [id number]  Baby Food breast                               GBS:   GC/CT: neg / neg  Contraception  Pap: 2019 negative  CBB   AMA  CS/VBAC G2 in 2011 (breech), G3 VBAC 2016   Support Person Carolyn Fus              Gestational age appropriate obstetric precautions including but not limited to vaginal bleeding, contractions, leaking of fluid and fetal movement were reviewed in detail with the patient.    - Discussed supportive measure for URI symptoms - If positive and symptoms were  to worsen discussed remdesivir infusion - if negative may follow up sometime next week  Return in about 10 days (around 11/17/2020) for ROB in 10 days.  Carolyn Austria, MD, [facility], [facility]

## 2020-11-17 ENCOUNTER — Encounter: Payer: Self-pay | Admitting: Obstetrics & Gynecology

## 2020-11-17 ENCOUNTER — Ambulatory Visit (INDEPENDENT_AMBULATORY_CARE_PROVIDER_SITE_OTHER): Payer: BC Managed Care – PPO | Admitting: Obstetrics & Gynecology

## 2020-11-17 ENCOUNTER — Other Ambulatory Visit: Payer: Self-pay

## 2020-11-17 VITALS — BP 100/60 | Wt 166.0 lb

## 2020-11-17 DIAGNOSIS — O0991 Supervision of high risk pregnancy, unspecified, first trimester: Secondary | ICD-10-CM

## 2020-11-17 DIAGNOSIS — O09521 Supervision of elderly multigravida, first trimester: Secondary | ICD-10-CM

## 2020-11-17 DIAGNOSIS — Z3A13 13 weeks gestation of pregnancy: Secondary | ICD-10-CM

## 2020-11-17 DIAGNOSIS — Z3689 Encounter for other specified antenatal screening: Secondary | ICD-10-CM

## 2020-11-17 NOTE — Progress Notes (Signed)
  Subjective  No nausea pain or bleeding Recovered from covid inf 10/2020  NIPT discussed Prior PTL/PTD 36 weeks  Objective  BP 100/60   Wt 166 lb (75.3 kg)   LMP 08/15/2020 (Exact Date)   BMI 31.37 kg/m  General: NAD Pumonary: no increased work of breathing Abdomen: gravid, non-tender Extremities: no edema Psychiatric: mood appropriate, affect full  Assessment  35 y.o. [id number] at [redacted]w[redacted]d by  05/22/2021, by Last Menstrual Period presenting for routine prenatal visit  Plan   Problem List Items Addressed This Visit      Other   Supervision of high risk pregnancy in first trimester   Multigravida of advanced maternal age in first trimester    Other Visit Diagnoses    [redacted] weeks gestation of pregnancy    -  Primary   Screening, antenatal, for fetal anatomic survey       Relevant Orders   US OB Comp + 14 Wk      FOURTH Problems (from 09/25/20 to present)    Problem Noted Resolved   History of preterm delivery, currently pregnant 10/10/2020 by Conard Novak, MD No   Supervision of high risk pregnancy in first trimester 09/25/2020 by Tresea Mall, CNM No   Overview Addendum 11/07/2020  9:10 PM by [name], MD    [facility]inic [facility] Prenatal Labs  Dating LMP = 8 week [place] Blood type: O positive  Genetic Screen NIPS: normal XY Antibody:negative  Anatomic [place]  Rubella:Immune Varicella:Immune  GTT Early: Hgb A1C               Third trimester:  RPR:NR  Rhogam  HBsAg: negative   Vaccines TDAP:                       Flu Shot: Covid: [id number]  Baby Food breast                               GBS:   GC/CT: neg / neg  Contraception  Pap: 2019 negative  CBB   AMA  CS/VBAC G2 in 2011 (breech), G3 VBAC 2016   Support Person Carolyn Harrison         Previous Version       Carolyn Major, MD, [facility], [facility] 11/17/2020  5:02 PM

## 2020-12-15 ENCOUNTER — Other Ambulatory Visit: Payer: Self-pay

## 2020-12-15 ENCOUNTER — Ambulatory Visit (INDEPENDENT_AMBULATORY_CARE_PROVIDER_SITE_OTHER): Payer: BC Managed Care – PPO | Admitting: Obstetrics & Gynecology

## 2020-12-15 ENCOUNTER — Encounter: Payer: Self-pay | Admitting: Obstetrics & Gynecology

## 2020-12-15 VITALS — BP 118/74 | Wt 168.0 lb

## 2020-12-15 DIAGNOSIS — O0991 Supervision of high risk pregnancy, unspecified, first trimester: Secondary | ICD-10-CM

## 2020-12-15 DIAGNOSIS — Z3A17 17 weeks gestation of pregnancy: Secondary | ICD-10-CM

## 2020-12-15 LAB — POCT URINALYSIS DIPSTICK OB
Glucose, UA: NEGATIVE
POC,PROTEIN,UA: NEGATIVE

## 2020-12-15 NOTE — Progress Notes (Signed)
  Subjective  Fetal Movement? flutters Contractions? no Leaking Fluid? no Vaginal Bleeding? no  Objective  BP 118/74   Wt 168 lb (76.2 kg)   LMP 08/15/2020 (Exact Date)   BMI 31.74 kg/m  General: NAD Pumonary: no increased work of breathing Abdomen: gravid, non-tender Extremities: no edema Psychiatric: mood appropriate, affect full  Assessment  35 y.o. [id number] at [redacted]w[redacted]d by  05/22/2021, by Last Menstrual Period presenting for routine prenatal visit  Plan   Problem List Items Addressed This Visit     [redacted] weeks gestation of pregnancy    -  Primary   Relevant Orders   POC Urinalysis Dipstick OB (Completed)      FOURTH Problems (from 09/25/20 to present)    Problem Noted Resolved   History of preterm delivery, currently pregnant 10/10/2020 by Conard Novak, MD No   Supervision of high risk pregnancy in first trimester 09/25/2020 by Tresea Mall, CNM No   Overview Addendum 11/07/2020  9:10 PM by [name], MD    [facility]inic [facility] Prenatal Labs  Dating LMP = 8 week [place] Blood type: O positive  Genetic Screen NIPS: normal XY Antibody:negative  Anatomic [place]  Rubella:Immune Varicella:Immune  GTT Early: Hgb A1C               Third trimester:  RPR:NR  Rhogam  HBsAg: negative   Vaccines TDAP:                       Flu Shot: Covid: [id number]  Baby Food breast                               GBS:   GC/CT: neg / neg  Contraception  Pap: 2019 negative  CBB   AMA  CS/VBAC G2 in 2011 (breech), G3 VBAC 2016   Support Person Maisie Fus        PNV  [place] 2 weeks  Annamarie Major, MD, [facility], [facility] 12/15/2020  5:23 PM

## 2020-12-22 NOTE — Telephone Encounter (Signed)
Called pt to adv that we had to change her u/s and rob due to availability on u/s tech.

## 2020-12-29 ENCOUNTER — Encounter: Payer: BC Managed Care – PPO | Admitting: Obstetrics and Gynecology

## 2020-12-29 ENCOUNTER — Ambulatory Visit: Payer: BC Managed Care – PPO

## 2020-12-29 DIAGNOSIS — Z3689 Encounter for other specified antenatal screening: Secondary | ICD-10-CM

## 2021-01-14 ENCOUNTER — Ambulatory Visit
Admission: RE | Admit: 2021-01-14 | Discharge: 2021-01-14 | Disposition: A | Discharge status: Home / Self Care | Payer: BC Managed Care – PPO | Source: Ambulatory Visit | Attending: Obstetrics & Gynecology | Admitting: Obstetrics & Gynecology

## 2021-01-14 ENCOUNTER — Other Ambulatory Visit: Payer: Self-pay

## 2021-01-14 DIAGNOSIS — Z3689 Encounter for other specified antenatal screening: Secondary | ICD-10-CM | POA: Insufficient documentation

## 2021-01-15 ENCOUNTER — Ambulatory Visit (INDEPENDENT_AMBULATORY_CARE_PROVIDER_SITE_OTHER): Payer: BC Managed Care – PPO | Admitting: Obstetrics and Gynecology

## 2021-01-15 ENCOUNTER — Encounter: Payer: BC Managed Care – PPO | Admitting: Obstetrics and Gynecology

## 2021-01-15 ENCOUNTER — Encounter: Payer: Self-pay | Admitting: Obstetrics and Gynecology

## 2021-01-15 VITALS — BP 122/78 | Wt 172.0 lb

## 2021-01-15 DIAGNOSIS — O0992 Supervision of high risk pregnancy, unspecified, second trimester: Secondary | ICD-10-CM

## 2021-01-15 DIAGNOSIS — O09522 Supervision of elderly multigravida, second trimester: Secondary | ICD-10-CM

## 2021-01-15 DIAGNOSIS — Z3A21 21 weeks gestation of pregnancy: Secondary | ICD-10-CM

## 2021-01-15 DIAGNOSIS — O09899 Supervision of other high risk pregnancies, unspecified trimester: Secondary | ICD-10-CM

## 2021-01-15 NOTE — Progress Notes (Signed)
  Routine Prenatal Care Visit  Subjective  Carolyn Harrison is a 35 y.o. [id number] at [redacted]w[redacted]d being seen today for ongoing prenatal care.  She is currently monitored for the following issues for this low-risk pregnancy and has Breast pain, right; Depression; Supervision of high risk pregnancy in first trimester; Multigravida of advanced maternal age in first trimester; and History of preterm delivery, currently pregnant on their problem list.  ----------------------------------------------------------------------------------- Patient reports no complaints.   Contractions: Not present. Vag. Bleeding: None.  Movement: Present. Leaking Fluid denies.  ----------------------------------------------------------------------------------- The following portions of the patient's history were reviewed and updated as appropriate: allergies, current medications, past family history, past medical history, past social history, past surgical history and problem list. Problem list updated.  Objective  Blood pressure 122/78, weight 172 lb (78 kg), last menstrual period 08/15/2020. Pregravid weight 165 lb (74.8 kg) Total Weight Gain 7 lb (3.175 kg) Urinalysis: Urine Protein    Urine Glucose    Fetal Status: Fetal Heart Rate (bpm): 150   Movement: Present     General:  Alert, oriented and cooperative. Patient is in no acute distress.  Skin: Skin is warm and dry. No rash noted.   Cardiovascular: Normal heart rate noted  Respiratory: Normal respiratory effort, no problems with respiration noted  Abdomen: Soft, gravid, appropriate for gestational age. Pain/Pressure: Absent     Pelvic:  Cervical exam deferred        Extremities: Normal range of motion.  Edema: None  Mental Status: Normal mood and affect. Normal behavior. Normal judgment and thought content.   Assessment   35 y.o. [id number] at [redacted]w[redacted]d by  05/22/2021, by Last Menstrual Period presenting for routine prenatal visit  Plan   FOURTH Problems (from 09/25/20  to present)    Problem Noted Resolved   History of preterm delivery, currently pregnant 10/10/2020 by Conard Novak, MD No   Supervision of high risk pregnancy in first trimester 09/25/2020 by Tresea Mall, CNM No   Overview Addendum 01/15/2021  4:50 PM by Conard Novak, MD    Clinic [facility] Prenatal Labs  Dating LMP = 8 week [place] Blood type: O positive  Genetic Screen NIPS: normal XY Antibody:negative  Anatomic [place] 01/14/21 at [facility] completed Rubella:Immune Varicella:Immune  GTT Early: Hgb A1C               Third trimester:  RPR:NR  Rhogam  HBsAg: negative   Vaccines TDAP:                       Flu Shot: Covid: [id number]  Baby Food breast                               GBS:   GC/CT: neg / neg  Contraception  Pap: 2019 negative  CBB   AMA  CS/VBAC G2 in 2011 (breech), G3 VBAC 2016   Support Person Maisie Fus         Previous Version       Preterm labor symptoms and general obstetric precautions including but not limited to vaginal bleeding, contractions, leaking of fluid and fetal movement were reviewed in detail with the patient. Please refer to After Visit Summary for other counseling recommendations.   Return in about 4 weeks (around 02/12/2021) for Routine Prenatal Appointment.   Thomasene Mohair, MD, [facility], [facility] 01/15/2021 5:00 PM

## 2021-02-12 ENCOUNTER — Ambulatory Visit (INDEPENDENT_AMBULATORY_CARE_PROVIDER_SITE_OTHER): Payer: BC Managed Care – PPO | Admitting: Advanced Practice Midwife

## 2021-02-12 ENCOUNTER — Encounter: Payer: Self-pay | Admitting: Advanced Practice Midwife

## 2021-02-12 ENCOUNTER — Other Ambulatory Visit: Payer: Self-pay

## 2021-02-12 VITALS — BP 120/80 | Wt 178.0 lb

## 2021-02-12 DIAGNOSIS — O09899 Supervision of other high risk pregnancies, unspecified trimester: Secondary | ICD-10-CM

## 2021-02-12 DIAGNOSIS — Z369 Encounter for antenatal screening, unspecified: Secondary | ICD-10-CM

## 2021-02-12 DIAGNOSIS — Z113 Encounter for screening for infections with a predominantly sexual mode of transmission: Secondary | ICD-10-CM

## 2021-02-12 DIAGNOSIS — O0992 Supervision of high risk pregnancy, unspecified, second trimester: Secondary | ICD-10-CM

## 2021-02-12 DIAGNOSIS — Z3A25 25 weeks gestation of pregnancy: Secondary | ICD-10-CM

## 2021-02-12 DIAGNOSIS — O09522 Supervision of elderly multigravida, second trimester: Secondary | ICD-10-CM

## 2021-02-12 DIAGNOSIS — Z131 Encounter for screening for diabetes mellitus: Secondary | ICD-10-CM

## 2021-02-12 DIAGNOSIS — Z130 Encounter for screening for diseases of the blood and blood-forming organs and certain disorders involving the immune mechanism: Secondary | ICD-10-CM

## 2021-02-12 NOTE — Progress Notes (Signed)
  Routine Prenatal Care Visit  Subjective  Carolyn Harrison is a 35 y.o. [id number] at [redacted]w[redacted]d being seen today for ongoing prenatal care.  She is currently monitored for the following issues for this high-risk pregnancy and has Breast pain, right; Depression; Supervision of high risk pregnancy in first trimester; Multigravida of advanced maternal age in second trimester; and History of preterm delivery, currently pregnant on their problem list.  ----------------------------------------------------------------------------------- Patient reports feeling tired this week. She is taking PNV with Fe.   Contractions: Not present. Vag. Bleeding: None.  Movement: Present. Leaking Fluid denies.  ----------------------------------------------------------------------------------- The following portions of the patient's history were reviewed and updated as appropriate: allergies, current medications, past family history, past medical history, past social history, past surgical history and problem list. Problem list updated.  Objective  Blood pressure 120/80, weight 178 lb (80.7 kg), last menstrual period 08/15/2020. Pregravid weight 165 lb (74.8 kg) Total Weight Gain 13 lb (5.897 kg) Urinalysis: Urine Protein    Urine Glucose    Fetal Status: Fetal Heart Rate (bpm): 137 Fundal Height: 26 cm Movement: Present     General:  Alert, oriented and cooperative. Patient is in no acute distress.  Skin: Skin is warm and dry. No rash noted.   Cardiovascular: Normal heart rate noted  Respiratory: Normal respiratory effort, no problems with respiration noted  Abdomen: Soft, gravid, appropriate for gestational age. Pain/Pressure: Absent     Pelvic:  Cervical exam deferred        Extremities: Normal range of motion.  Edema: None  Mental Status: Normal mood and affect. Normal behavior. Normal judgment and thought content.   Assessment   35 y.o. [id number] at [redacted]w[redacted]d by  05/22/2021, by Last Menstrual Period presenting for  routine prenatal visit  Plan   FOURTH Problems (from 09/25/20 to present)    Problem Noted Resolved   History of preterm delivery, currently pregnant 10/10/2020 by Conard Novak, MD No   Supervision of high risk pregnancy in first trimester 09/25/2020 by Tresea Mall, CNM No   Overview Addendum 01/15/2021  4:50 PM by Conard Novak, MD    [facility]inic [facility] Prenatal Labs  Dating LMP = 8 week [place] Blood type: O positive  Genetic Screen NIPS: normal XY Antibody:negative  Anatomic [place] 01/14/21 at [facility] completed Rubella:Immune Varicella:Immune  GTT Early: Hgb A1C               Third trimester:  RPR:NR  Rhogam  HBsAg: negative   Vaccines TDAP:                       Flu Shot: Covid: [id number]  Baby Food breast                               GBS:   GC/CT: neg / neg  Contraception  Pap: 2019 negative  CBB   AMA  CS/VBAC G2 in 2011 (breech), G3 VBAC 2016   Support Person Maisie Fus         Previous Version       Preterm labor symptoms and general obstetric precautions including but not limited to vaginal bleeding, contractions, leaking of fluid and fetal movement were reviewed in detail with the patient.   Return in about 3 weeks (around 03/05/2021) for 28 wk labs and rob.  Tresea Mall, CNM 02/12/2021 4:49 PM

## 2021-03-05 ENCOUNTER — Ambulatory Visit (INDEPENDENT_AMBULATORY_CARE_PROVIDER_SITE_OTHER): Payer: BC Managed Care – PPO | Admitting: Obstetrics and Gynecology

## 2021-03-05 ENCOUNTER — Other Ambulatory Visit: Payer: BC Managed Care – PPO

## 2021-03-05 ENCOUNTER — Other Ambulatory Visit: Payer: Self-pay

## 2021-03-05 VITALS — BP 120/78 | Wt 179.0 lb

## 2021-03-05 DIAGNOSIS — O0993 Supervision of high risk pregnancy, unspecified, third trimester: Secondary | ICD-10-CM

## 2021-03-05 DIAGNOSIS — O09522 Supervision of elderly multigravida, second trimester: Secondary | ICD-10-CM

## 2021-03-05 DIAGNOSIS — F32A Depression, unspecified: Secondary | ICD-10-CM

## 2021-03-05 DIAGNOSIS — F419 Anxiety disorder, unspecified: Secondary | ICD-10-CM

## 2021-03-05 DIAGNOSIS — O0992 Supervision of high risk pregnancy, unspecified, second trimester: Secondary | ICD-10-CM

## 2021-03-05 DIAGNOSIS — O99343 Other mental disorders complicating pregnancy, third trimester: Secondary | ICD-10-CM

## 2021-03-05 DIAGNOSIS — Z369 Encounter for antenatal screening, unspecified: Secondary | ICD-10-CM

## 2021-03-05 DIAGNOSIS — Z130 Encounter for screening for diseases of the blood and blood-forming organs and certain disorders involving the immune mechanism: Secondary | ICD-10-CM

## 2021-03-05 DIAGNOSIS — Z1332 Encounter for screening for maternal depression: Secondary | ICD-10-CM

## 2021-03-05 DIAGNOSIS — Z113 Encounter for screening for infections with a predominantly sexual mode of transmission: Secondary | ICD-10-CM

## 2021-03-05 DIAGNOSIS — Z131 Encounter for screening for diabetes mellitus: Secondary | ICD-10-CM

## 2021-03-05 LAB — POCT URINALYSIS DIPSTICK OB
Glucose, UA: NEGATIVE
POC,PROTEIN,UA: NEGATIVE

## 2021-03-05 MED ORDER — FLUOXETINE HCL 40 MG PO CAPS: 40.0000 mg | ORAL_CAPSULE | Freq: Every day | ORAL | 3 refills | Status: DC

## 2021-03-05 MED ORDER — FLUOXETINE HCL 20 MG PO CAPS: 20.0000 mg | ORAL_CAPSULE | Freq: Every day | ORAL | 0 refills | Status: DC

## 2021-03-05 NOTE — Progress Notes (Signed)
Routine Prenatal Care Visit  Subjective  Carolyn Harrison is a 35 y.o. [id number] at [redacted]w[redacted]d being seen today for ongoing prenatal care.  She is currently monitored for the following issues for this high-risk pregnancy and has Breast pain, right; Depression; Supervision of high-risk pregnancy, third trimester; Multigravida of advanced maternal age, third trimester; and History of preterm delivery, currently pregnant on their problem list.  ----------------------------------------------------------------------------------- Patient reports increase in symptoms of anxiety and depression. States she feels she is currently coping, but with difficulty. Patient reports she had been taking 40mg  of fluoxitine daily prior to pregnancy, which was effective in managing her symptoms.  Contractions: Not present. Vag. Bleeding: None.  Movement: Present. Denies leaking of fluid.  ----------------------------------------------------------------------------------- The following portions of the patient's history were reviewed and updated as appropriate: allergies, current medications, past family history, past medical history, past social history, past surgical history and problem list. Problem list updated.  Objective  Blood pressure 120/78, weight 179 lb (81.2 kg), last menstrual period 08/15/2020. Pregravid weight 165 lb (74.8 kg) Total Weight Gain 14 lb (6.35 kg) Urinalysis:      Fetal Status: Fetal Heart Rate (bpm): 158 Fundal Height: 29 cm Movement: Present     General:  Alert, oriented and cooperative. Patient is in no acute distress.  Skin: Skin is warm and dry. No rash noted.   Cardiovascular: Normal heart rate noted  Respiratory: Normal respiratory effort, no problems with respiration noted  Abdomen: Soft, gravid, appropriate for gestational age. Pain/Pressure: Absent     Pelvic:  Cervical exam deferred        Extremities: Normal range of motion.  Edema: None  ental Status: Normal mood and affect.  Normal behavior. Normal judgment and thought content.    Edinburgh Postnatal Depression Scale - 03/05/21 0910      Edinburgh Postnatal Depression Scale:  In the Past 7 Days   I have been able to laugh and see the funny side of things. 2    I have looked forward with enjoyment to things. 1    I have blamed myself unnecessarily when things went wrong. 3    I have been anxious or worried for no good reason. 3    I have felt scared or panicky for no good reason. 3    Things have been getting on top of me. 2    I have been so unhappy that I have had difficulty sleeping. 2    I have felt sad or miserable. 2    I have been so unhappy that I have been crying. 1    The thought of harming myself has occurred to me. 1    Edinburgh Postnatal Depression Scale Total 20            Assessment   35 y.o. 30 at [redacted]w[redacted]d by  05/22/2021, by Last Menstrual Period presenting for routine prenatal visit  Plan   FOURTH Problems (from 09/25/20 to present)    Problem Noted Resolved   History of preterm delivery, currently pregnant 10/10/2020 by 12/08/2020, MD No   Supervision of high-risk pregnancy, third trimester 09/25/2020 by 09/27/2020, CNM No   Overview Addendum 03/05/2021  9:07 AM by 05/05/2021, CNM    [facility]inic [facility] Prenatal Labs  Dating LMP = 8 week Zipporah Plants Blood type: O positive  Genetic Screen NIPS: normal XY Antibody:negative  Anatomic [place] 01/14/21 at [facility] completed Rubella:Immune Varicella:Immune  GTT Early: Hgb A1C  Third trimester:  RPR:NR  Rhogam  n/a HBsAg: negative   Vaccines TDAP:                       Flu Shot: Covid: vaccinated, had Covid 1/22 [id number]  Baby Food breast                               GBS:   GC/CT: neg / neg  Contraception  Pap: 2019 negative  CBB   AMA  CS/VBAC G2 in 2011 (breech), G3 VBAC 2016   Support Person Maisie Fus         Previous Version      -EPDS: 20 - reviewed with patient. Discussion regarding perinatal mood concerns -  plan to restart SSRI therapy - will initiate fluoxitine and titrate to previously effective dose. Plan to repeat EPDS at 32 week visit.   Preterm precautions including but not limited to vaginal bleeding, contractions, leaking of fluid and fetal movement were reviewed in detail with the patient.    Return in about 2 weeks (around 03/19/2021) for ROB.  Zipporah Plants, CNM, MSN [facility], [facility] 03/05/2021, 9:09 AM

## 2021-03-06 LAB — 28 WEEK RH+PANEL
Basophils Absolute: 0 10*3/uL (ref 0.0–0.2)
Basos: 0 %
EOS (ABSOLUTE): 0.1 10*3/uL (ref 0.0–0.4)
Eos: 1 %
Gestational Diabetes Screen: 150 mg/dL — ABNORMAL HIGH (ref 65–139)
HIV Screen 4th Generation wRfx: NONREACTIVE
Hematocrit: 32.5 % — ABNORMAL LOW (ref 34.0–46.6)
Hemoglobin: 10.9 g/dL — ABNORMAL LOW (ref 11.1–15.9)
Immature Grans (Abs): 0.1 10*3/uL (ref 0.0–0.1)
Immature Granulocytes: 1 %
Lymphocytes Absolute: 1.1 10*3/uL (ref 0.7–3.1)
Lymphs: 12 %
MCH: 30.1 pg (ref 26.6–33.0)
MCHC: 33.5 g/dL (ref 31.5–35.7)
MCV: 90 fL (ref 79–97)
Monocytes Absolute: 0.4 10*3/uL (ref 0.1–0.9)
Monocytes: 4 %
Neutrophils Absolute: 7.3 10*3/uL — ABNORMAL HIGH (ref 1.4–7.0)
Neutrophils: 82 %
Platelets: 162 10*3/uL (ref 150–450)
RBC: 3.62 x10E6/uL — ABNORMAL LOW (ref 3.77–5.28)
RDW: 12.3 % (ref 11.7–15.4)
RPR Ser Ql: NONREACTIVE
WBC: 8.9 10*3/uL (ref 3.4–10.8)

## 2021-03-12 ENCOUNTER — Other Ambulatory Visit: Payer: Self-pay | Admitting: Advanced Practice Midwife

## 2021-03-12 DIAGNOSIS — R7309 Other abnormal glucose: Secondary | ICD-10-CM

## 2021-03-12 DIAGNOSIS — O09523 Supervision of elderly multigravida, third trimester: Secondary | ICD-10-CM

## 2021-03-12 DIAGNOSIS — O0993 Supervision of high risk pregnancy, unspecified, third trimester: Secondary | ICD-10-CM

## 2021-03-12 DIAGNOSIS — O09899 Supervision of other high risk pregnancies, unspecified trimester: Secondary | ICD-10-CM

## 2021-03-12 NOTE — Progress Notes (Signed)
3 hr gtt ordered. Message to patient. 

## 2021-03-23 ENCOUNTER — Other Ambulatory Visit: Payer: Self-pay

## 2021-03-23 ENCOUNTER — Ambulatory Visit (INDEPENDENT_AMBULATORY_CARE_PROVIDER_SITE_OTHER): Payer: BC Managed Care – PPO | Admitting: Obstetrics and Gynecology

## 2021-03-23 VITALS — BP 122/78 | Wt 181.0 lb

## 2021-03-23 DIAGNOSIS — O0993 Supervision of high risk pregnancy, unspecified, third trimester: Secondary | ICD-10-CM

## 2021-03-23 DIAGNOSIS — Z3A37 37 weeks gestation of pregnancy: Secondary | ICD-10-CM

## 2021-03-23 DIAGNOSIS — O09523 Supervision of elderly multigravida, third trimester: Secondary | ICD-10-CM

## 2021-03-23 DIAGNOSIS — O99013 Anemia complicating pregnancy, third trimester: Secondary | ICD-10-CM | POA: Insufficient documentation

## 2021-03-23 DIAGNOSIS — O09899 Supervision of other high risk pregnancies, unspecified trimester: Secondary | ICD-10-CM

## 2021-03-23 LAB — POCT URINALYSIS DIPSTICK OB
Glucose, UA: NEGATIVE
POC,PROTEIN,UA: NEGATIVE

## 2021-03-23 MED ORDER — FERROUS SULFATE 325 (65 FE) MG PO TABS: 325.0000 mg | ORAL_TABLET | Freq: Every day | ORAL | 1 refills | Status: DC

## 2021-03-23 NOTE — Progress Notes (Signed)
ROB - no concerns. RM 4 °

## 2021-03-23 NOTE — Addendum Note (Signed)
Addended by: Fortunato Curling R on: 03/23/2021 02:07 PM   Modules accepted: Orders

## 2021-03-23 NOTE — Progress Notes (Addendum)
Routine Prenatal Care Visit  Subjective  Carolyn Harrison is a 35 y.o. [id number] at [redacted]w[redacted]d being seen today for ongoing prenatal care.  She is currently monitored for the following issues for this high-risk pregnancy and has Breast pain, right; Depression; Supervision of high-risk pregnancy, third trimester; Multigravida of advanced maternal age, third trimester; History of preterm delivery, currently pregnant; and Anemia affecting pregnancy in third trimester on their problem list.  ----------------------------------------------------------------------------------- Patient reports no complaints.   Contractions: Not present. Vag. Bleeding: None.  Movement: Present. Denies leaking of fluid.  ----------------------------------------------------------------------------------- The following portions of the patient's history were reviewed and updated as appropriate: allergies, current medications, past family history, past medical history, past social history, past surgical history and problem list. Problem list updated.   Objective  Blood pressure 122/78, weight 181 lb (82.1 kg), last menstrual period 08/15/2020. Pregravid weight 165 lb (74.8 kg) Total Weight Gain 16 lb (7.258 kg) Urinalysis:      Fetal Status: Fetal Heart Rate (bpm): 145 Fundal Height: 31 cm Movement: Present     General:  Alert, oriented and cooperative. Patient is in no acute distress.  Skin: Skin is warm and dry. No rash noted.   Cardiovascular: Normal heart rate noted  Respiratory: Normal respiratory effort, no problems with respiration noted  Abdomen: Soft, gravid, appropriate for gestational age. Pain/Pressure: Absent     Pelvic:  Cervical exam deferred        Extremities: Normal range of motion.     ental Status: Normal mood and affect. Normal behavior. Normal judgment and thought content.     Assessment   35 y.o. [id number] at [redacted]w[redacted]d by  05/22/2021, by Last Menstrual Period presenting for routine prenatal  visit  Plan   FOURTH Problems (from 09/25/20 to present)     Problem Noted Resolved   History of preterm delivery, currently pregnant 10/10/2020 by Conard Novak, MD No   Supervision of high-risk pregnancy, third trimester 09/25/2020 by Tresea Mall, CNM No   Overview Addendum 03/05/2021  9:07 AM by Zipporah Plants, CNM    [facility]inic [facility] Prenatal Labs  Dating LMP = 8 week [place] Blood type: O positive  Genetic Screen NIPS: normal XY Antibody:negative  Anatomic [place] 01/14/21 at [facility] completed Rubella:Immune Varicella:Immune  GTT Early: Hgb A1C               Third trimester: 150 RPR:NR  Rhogam  n/a HBsAg: negative   Vaccines TDAP:                       Flu Shot: Covid: [id number]  Baby Food breast                               GBS:   GC/CT: neg / neg  Contraception  Pap: 2019 negative  CBB   AMA  CS/VBAC G2 in 2011 (breech), G3 VBAC 2016   Support Person Maisie Fus               Gestational age appropriate obstetric precautions including but not limited to vaginal bleeding, contractions, leaking of fluid and fetal movement were reviewed in detail with the patient.    1) Elevated 1-hr  - discussed 3-hr ASAP  2) Anemia  - Rx iron  Return in about 2 weeks (around 04/06/2021) for ASAP 3-hr OGTT, and then 2 weeks ROB.  [name], MD, [facility], [facility] 03/23/2021,  2:03 PM

## 2021-04-07 ENCOUNTER — Other Ambulatory Visit: Payer: Self-pay

## 2021-04-07 ENCOUNTER — Ambulatory Visit (INDEPENDENT_AMBULATORY_CARE_PROVIDER_SITE_OTHER): Payer: BC Managed Care – PPO | Admitting: Obstetrics

## 2021-04-07 VITALS — BP 122/74 | Wt 183.0 lb

## 2021-04-07 DIAGNOSIS — Z3A33 33 weeks gestation of pregnancy: Secondary | ICD-10-CM

## 2021-04-07 DIAGNOSIS — O0993 Supervision of high risk pregnancy, unspecified, third trimester: Secondary | ICD-10-CM

## 2021-04-07 LAB — POCT URINALYSIS DIPSTICK OB
Glucose, UA: NEGATIVE
POC,PROTEIN,UA: NEGATIVE

## 2021-04-07 NOTE — Progress Notes (Signed)
Some pelvic pressure since last visit.

## 2021-04-07 NOTE — Progress Notes (Signed)
Routine Prenatal Care Visit  Subjective  Carolyn Harrison is a 35 y.o. [id number] at [redacted]w[redacted]d being seen today for ongoing prenatal care.  She is currently monitored for the following issues for this high-risk pregnancy and has Breast pain, right; Depression; Supervision of high-risk pregnancy, third trimester; Multigravida of advanced maternal age, third trimester; History of preterm delivery, currently pregnant; and Anemia affecting pregnancy in third trimester on their problem list.  ----------------------------------------------------------------------------------- Patient reports no complaints.  She has not had a 3hr GTT ( 1hr GTT was 150). The office did not call her to set this up as she had been told. It is now scheduled for 03/10/2021. Contractions: Irritability. Vag. Bleeding: None.  Movement: Present. Leaking Fluid denies.  ----------------------------------------------------------------------------------- The following portions of the patient's history were reviewed and updated as appropriate: allergies, current medications, past family history, past medical history, past social history, past surgical history and problem list. Problem list updated.  Objective  Blood pressure 122/74, weight 183 lb (83 kg), last menstrual period 08/15/2020. Pregravid weight 165 lb (74.8 kg) Total Weight Gain 18 lb (8.165 kg) Urinalysis: Urine Protein Negative  Urine Glucose Negative  Fetal Status:     Movement: Present     General:  Alert, oriented and cooperative. Patient is in no acute distress.  Skin: Skin is warm and dry. No rash noted.   Cardiovascular: Normal heart rate noted  Respiratory: Normal respiratory effort, no problems with respiration noted  Abdomen: Soft, gravid, appropriate for gestational age. Pain/Pressure: Present     Pelvic:  Cervical exam deferred        Extremities: Normal range of motion.     Mental Status: Normal mood and affect. Normal behavior. Normal judgment and thought  content.   Assessment   35 y.o. T5V7616 at [redacted]w[redacted]d by  05/22/2021, by Last Menstrual Period presenting for routine prenatal visit  Plan   FOURTH Problems (from 09/25/20 to present)    Problem Noted Resolved   Anemia affecting pregnancy in third trimester 03/23/2021 by [name], MD No   History of preterm delivery, currently pregnant 10/10/2020 by Conard Novak, MD No   Supervision of high-risk pregnancy, third trimester 09/25/2020 by Tresea Mall, CNM No   Overview Addendum 03/23/2021  2:01 PM by [name], MD    [facility]inic [facility] Prenatal Labs  Dating LMP = 8 week [place] Blood type: O positive  Genetic Screen NIPS: normal XY Antibody:negative  Anatomic [place] 01/14/21 at [facility] completed Rubella:Immune Varicella:Immune  GTT Early: Hgb A1C               Third trimester: 150 RPR:NR  Rhogam  n/a HBsAg: negative   Vaccines TDAP:                       Flu Shot: Covid: [id number]  Baby Food breast                               GBS:   GC/CT: neg / neg  Contraception  Pap: 2019 negative  CBB   AMA  CS/VBAC G2 in 2011 (breech), G3 VBAC 2016   Support Person [name]              Preterm labor symptoms and general obstetric precautions including but not limited to vaginal bleeding, contractions, leaking of fluid and fetal movement were reviewed in detail with the patient. Please refer to After Visit Summary for other counseling recommendations.  Bedside ultrasound used to determine fetal lie- breech today. Breech tilt exercises suggested to her. She left before doing an [place] score- Please do at there three hr testing.  Return in about 2 weeks (around 04/21/2021) for return OB.  Mirna Mires, CNM  04/07/2021 2:40 PM

## 2021-04-08 ENCOUNTER — Encounter: Payer: BC Managed Care – PPO | Admitting: Family Medicine

## 2021-04-08 ENCOUNTER — Other Ambulatory Visit: Payer: BC Managed Care – PPO

## 2021-04-08 ENCOUNTER — Encounter: Payer: BC Managed Care – PPO | Admitting: Nurse Practitioner

## 2021-04-08 DIAGNOSIS — O09523 Supervision of elderly multigravida, third trimester: Secondary | ICD-10-CM

## 2021-04-08 DIAGNOSIS — R7309 Other abnormal glucose: Secondary | ICD-10-CM

## 2021-04-08 DIAGNOSIS — O0993 Supervision of high risk pregnancy, unspecified, third trimester: Secondary | ICD-10-CM

## 2021-04-09 LAB — GESTATIONAL GLUCOSE TOLERANCE
Glucose, Fasting: 79 mg/dL (ref 65–94)
Glucose, GTT - 1 Hour: 140 mg/dL (ref 65–179)
Glucose, GTT - 2 Hour: 159 mg/dL — ABNORMAL HIGH (ref 65–154)
Glucose, GTT - 3 Hour: 149 mg/dL — ABNORMAL HIGH (ref 65–139)

## 2021-04-14 ENCOUNTER — Other Ambulatory Visit: Payer: Self-pay | Admitting: Advanced Practice Midwife

## 2021-04-14 DIAGNOSIS — O0993 Supervision of high risk pregnancy, unspecified, third trimester: Secondary | ICD-10-CM

## 2021-04-14 DIAGNOSIS — O24419 Gestational diabetes mellitus in pregnancy, unspecified control: Secondary | ICD-10-CM

## 2021-04-14 DIAGNOSIS — O09523 Supervision of elderly multigravida, third trimester: Secondary | ICD-10-CM

## 2021-04-14 MED ORDER — BLOOD GLUCOSE MONITOR KIT: PACK | 0 refills | Status: DC

## 2021-04-14 NOTE — Progress Notes (Signed)
Rx glucometer/supplies, referral to lifestyles, message to patient.

## 2021-04-20 ENCOUNTER — Other Ambulatory Visit: Payer: Self-pay | Admitting: Advanced Practice Midwife

## 2021-04-20 DIAGNOSIS — O0993 Supervision of high risk pregnancy, unspecified, third trimester: Secondary | ICD-10-CM

## 2021-04-20 DIAGNOSIS — O24419 Gestational diabetes mellitus in pregnancy, unspecified control: Secondary | ICD-10-CM

## 2021-04-20 MED ORDER — ACCU-CHEK SMARTVIEW VI STRP: ORAL_STRIP | 12 refills | Status: DC

## 2021-04-20 MED ORDER — ACCU-CHEK NANO SMARTVIEW W/DEVICE KIT: 1.0000 | PACK | 0 refills | Status: DC

## 2021-04-20 MED ORDER — ACCU-CHEK SOFTCLIX LANCETS MISC: 5 refills | Status: DC

## 2021-04-20 NOTE — Telephone Encounter (Signed)
Please let the patient know that I reordered all the supplies. The original order was for "glucometer Kit" and should have had some strips and lancets with it. It is very difficult to figure out what glucometers are covered by insurance these days. It seems that they are all over the counter. I put a note on the order for the pharmacist to give patient whatever supplies are covered.

## 2021-04-20 NOTE — Progress Notes (Signed)
Glucometer supplies ordered

## 2021-04-21 ENCOUNTER — Encounter: Payer: Self-pay | Admitting: Obstetrics and Gynecology

## 2021-04-21 ENCOUNTER — Ambulatory Visit (INDEPENDENT_AMBULATORY_CARE_PROVIDER_SITE_OTHER): Payer: BC Managed Care – PPO | Admitting: Obstetrics and Gynecology

## 2021-04-21 ENCOUNTER — Other Ambulatory Visit: Payer: Self-pay

## 2021-04-21 VITALS — BP 100/70 | Wt 179.0 lb

## 2021-04-21 DIAGNOSIS — O09523 Supervision of elderly multigravida, third trimester: Secondary | ICD-10-CM

## 2021-04-21 DIAGNOSIS — O09899 Supervision of other high risk pregnancies, unspecified trimester: Secondary | ICD-10-CM

## 2021-04-21 DIAGNOSIS — Z3A35 35 weeks gestation of pregnancy: Secondary | ICD-10-CM

## 2021-04-21 DIAGNOSIS — O24419 Gestational diabetes mellitus in pregnancy, unspecified control: Secondary | ICD-10-CM

## 2021-04-21 DIAGNOSIS — O0993 Supervision of high risk pregnancy, unspecified, third trimester: Secondary | ICD-10-CM

## 2021-04-21 DIAGNOSIS — O99013 Anemia complicating pregnancy, third trimester: Secondary | ICD-10-CM

## 2021-04-21 LAB — POCT URINALYSIS DIPSTICK OB
Glucose, UA: NEGATIVE
POC,PROTEIN,UA: NEGATIVE

## 2021-04-21 NOTE — Progress Notes (Signed)
Routine Prenatal Care Visit  Subjective  Carolyn Harrison is a 35 y.o. [id number] at [redacted]w[redacted]d being seen today for ongoing prenatal care.  She is currently monitored for the following issues for this high-risk pregnancy and has Breast pain, right; Depression; Supervision of high-risk pregnancy, third trimester; Multigravida of advanced maternal age, third trimester; History of preterm delivery, currently pregnant; Anemia affecting pregnancy in third trimester; and Gestational diabetes mellitus (GDM) affecting fourth pregnancy on their problem list.  ----------------------------------------------------------------------------------- Patient reports no complaints.   Contractions: Not present. Vag. Bleeding: None.  Movement: Present. Leaking Fluid denies.  GDM: has not yet been able to get supplies for testing.  ----------------------------------------------------------------------------------- The following portions of the patient's history were reviewed and updated as appropriate: allergies, current medications, past family history, past medical history, past social history, past surgical history and problem list. Problem list updated.  Objective  Blood pressure 100/70, weight 179 lb (81.2 kg), last menstrual period 08/15/2020. Pregravid weight 165 lb (74.8 kg) Total Weight Gain 14 lb (6.35 kg) Urinalysis: Urine Protein Negative  Urine Glucose Negative  Fetal Status: Fetal Heart Rate (bpm): 140 Fundal Height: 36 cm Movement: Present  Presentation: Complete Breech  General:  Alert, oriented and cooperative. Patient is in no acute distress.  Skin: Skin is warm and dry. No rash noted.   Cardiovascular: Normal heart rate noted  Respiratory: Normal respiratory effort, no problems with respiration noted  Abdomen: Soft, gravid, appropriate for gestational age. Pain/Pressure: Present     Pelvic:  Cervical exam deferred        Extremities: Normal range of motion.  Edema: None  Mental Status: Normal mood  and affect. Normal behavior. Normal judgment and thought content.   Assessment   35 y.o. S8N4627 at [redacted]w[redacted]d by  05/22/2021, by Last Menstrual Period presenting for routine prenatal visit  Plan   FOURTH Problems (from 09/25/20 to present)     Problem Noted Resolved   Gestational diabetes mellitus (GDM) affecting fourth pregnancy 04/20/2021 by Carolyn Harrison, CNM No   Anemia affecting pregnancy in third trimester 03/23/2021 by Carolyn Austria, MD No   History of preterm delivery, currently pregnant 10/10/2020 by Carolyn Novak, MD No   Supervision of high-risk pregnancy, third trimester 09/25/2020 by Carolyn Harrison, CNM No   Overview Addendum 03/23/2021  2:01 PM by Carolyn Austria, MD    [facility]inic [facility] Prenatal Labs  Dating LMP = 8 week [place] Blood type: O positive  Genetic Screen NIPS: normal XY Antibody:negative  Anatomic [place] 01/14/21 at [facility] completed Rubella:Immune Varicella:Immune  GTT Early: Hgb A1C               Third trimester: 150 RPR:NR  Rhogam  n/a HBsAg: negative   Vaccines TDAP:                       Flu Shot: Covid: [id number]  Baby Food breast                               GBS:   GC/CT: neg / neg  Contraception  Pap: 2019 negative  CBB   AMA  CS/VBAC G2 in 2011 (breech), G3 VBAC 2016   Support Person Carolyn Harrison              Preterm labor symptoms and general obstetric precautions including but not limited to vaginal bleeding, contractions, leaking of fluid and fetal movement were reviewed in detail with the patient.  Please refer to After Visit Summary for other counseling recommendations.   - will work to ensure she gets her GDM testing supplies and discussed record keep of BG level.   -F/u 1 week  - discussed breech presentation, possible version. She wants to consider.   Return in about 1 week (around 04/28/2021) for Routine Prenatal Appointment.   Carolyn Mohair, MD, [facility], [facility] 04/21/2021 2:21 PM

## 2021-04-23 ENCOUNTER — Inpatient Hospital Stay: Payer: BC Managed Care – PPO | Admitting: Anesthesiology

## 2021-04-23 ENCOUNTER — Other Ambulatory Visit: Payer: Self-pay

## 2021-04-23 ENCOUNTER — Encounter: Payer: Self-pay | Admitting: Obstetrics and Gynecology

## 2021-04-23 ENCOUNTER — Encounter
Admission: EM | Disposition: A | Discharge status: Home / Self Care | Payer: Self-pay | Source: Home / Self Care | Attending: Obstetrics and Gynecology

## 2021-04-23 ENCOUNTER — Inpatient Hospital Stay
Admission: EM | Admit: 2021-04-23 | Discharge: 2021-04-26 | DRG: 786 | Disposition: A | Discharge status: Home / Self Care | Payer: BC Managed Care – PPO | Attending: Obstetrics and Gynecology | Admitting: Obstetrics and Gynecology

## 2021-04-23 DIAGNOSIS — O99013 Anemia complicating pregnancy, third trimester: Secondary | ICD-10-CM | POA: Diagnosis present

## 2021-04-23 DIAGNOSIS — O99344 Other mental disorders complicating childbirth: Secondary | ICD-10-CM | POA: Diagnosis present

## 2021-04-23 DIAGNOSIS — Z98891 History of uterine scar from previous surgery: Secondary | ICD-10-CM

## 2021-04-23 DIAGNOSIS — F32A Depression, unspecified: Secondary | ICD-10-CM | POA: Diagnosis present

## 2021-04-23 DIAGNOSIS — Z20822 Contact with and (suspected) exposure to covid-19: Secondary | ICD-10-CM | POA: Diagnosis present

## 2021-04-23 DIAGNOSIS — O34211 Maternal care for low transverse scar from previous cesarean delivery: Secondary | ICD-10-CM | POA: Diagnosis present

## 2021-04-23 DIAGNOSIS — O0993 Supervision of high risk pregnancy, unspecified, third trimester: Secondary | ICD-10-CM

## 2021-04-23 DIAGNOSIS — D62 Acute posthemorrhagic anemia: Secondary | ICD-10-CM | POA: Diagnosis present

## 2021-04-23 DIAGNOSIS — Z23 Encounter for immunization: Secondary | ICD-10-CM | POA: Diagnosis not present

## 2021-04-23 DIAGNOSIS — O321XX0 Maternal care for breech presentation, not applicable or unspecified: Secondary | ICD-10-CM | POA: Diagnosis present

## 2021-04-23 DIAGNOSIS — O9081 Anemia of the puerperium: Secondary | ICD-10-CM | POA: Diagnosis present

## 2021-04-23 DIAGNOSIS — O09523 Supervision of elderly multigravida, third trimester: Secondary | ICD-10-CM | POA: Diagnosis present

## 2021-04-23 DIAGNOSIS — Z3A35 35 weeks gestation of pregnancy: Secondary | ICD-10-CM

## 2021-04-23 DIAGNOSIS — O329XX0 Maternal care for malpresentation of fetus, unspecified, not applicable or unspecified: Secondary | ICD-10-CM | POA: Diagnosis present

## 2021-04-23 DIAGNOSIS — O24420 Gestational diabetes mellitus in childbirth, diet controlled: Secondary | ICD-10-CM | POA: Diagnosis present

## 2021-04-23 DIAGNOSIS — O09899 Supervision of other high risk pregnancies, unspecified trimester: Secondary | ICD-10-CM

## 2021-04-23 DIAGNOSIS — O24419 Gestational diabetes mellitus in pregnancy, unspecified control: Secondary | ICD-10-CM | POA: Diagnosis present

## 2021-04-23 HISTORY — DX: Anxiety disorder, unspecified: F41.9

## 2021-04-23 HISTORY — DX: Gestational diabetes mellitus in pregnancy, unspecified control: O24.419

## 2021-04-23 LAB — CBC
HCT: 33.4 % — ABNORMAL LOW (ref 36.0–46.0)
Hemoglobin: 11.2 g/dL — ABNORMAL LOW (ref 12.0–15.0)
MCH: 30.7 pg (ref 26.0–34.0)
MCHC: 33.5 g/dL (ref 30.0–36.0)
MCV: 91.5 fL (ref 80.0–100.0)
Platelets: 138 10*3/uL — ABNORMAL LOW (ref 150–400)
RBC: 3.65 MIL/uL — ABNORMAL LOW (ref 3.87–5.11)
RDW: 14 % (ref 11.5–15.5)
WBC: 12.6 10*3/uL — ABNORMAL HIGH (ref 4.0–10.5)
nRBC: 0 % (ref 0.0–0.2)

## 2021-04-23 LAB — GLUCOSE, CAPILLARY
Glucose-Capillary: 92 mg/dL (ref 70–99)
Glucose-Capillary: 130 mg/dL — ABNORMAL HIGH (ref 70–99)

## 2021-04-23 LAB — TYPE AND SCREEN
ABO/RH(D): O POS
Antibody Screen: NEGATIVE

## 2021-04-23 LAB — RESP PANEL BY RT-PCR (FLU A&B, COVID) ARPGX2
Influenza A by PCR: NEGATIVE
Influenza B by PCR: NEGATIVE
SARS Coronavirus 2 by RT PCR: NEGATIVE

## 2021-04-23 LAB — ABO/RH: ABO/RH(D): O POS

## 2021-04-23 SURGERY — Surgical Case
Anesthesia: Spinal

## 2021-04-23 MED ORDER — BUPIVACAINE HCL (PF) 0.5 % IJ SOLN: 5.0000 mL | Freq: Once | INTRAMUSCULAR | Status: DC

## 2021-04-23 MED ORDER — FENTANYL CITRATE (PF) 100 MCG/2ML IJ SOLN
INTRAMUSCULAR | Status: DC | PRN
  Administered 2021-04-23: 15 ug via INTRATHECAL

## 2021-04-23 MED ORDER — LIDOCAINE HCL (PF) 1 % IJ SOLN
INTRAMUSCULAR | Status: DC | PRN
  Administered 2021-04-23: 3 mL

## 2021-04-23 MED ORDER — KETOROLAC TROMETHAMINE 30 MG/ML IJ SOLN
30.0000 mg | Freq: Four times a day (QID) | INTRAMUSCULAR | Status: AC
Start: 2021-04-23 — End: 2021-04-24
  Administered 2021-04-23 – 2021-04-24 (×3): 30 mg via INTRAVENOUS
  Filled 2021-04-23 (×4): qty 1

## 2021-04-23 MED ORDER — PRENATAL MULTIVITAMIN CH
1.0000 | ORAL_TABLET | Freq: Every day | ORAL | Status: DC
  Administered 2021-04-24 – 2021-04-26 (×3): 1 via ORAL
  Filled 2021-04-23 (×3): qty 1

## 2021-04-23 MED ORDER — OXYCODONE HCL 5 MG PO TABS: 5.0000 mg | ORAL_TABLET | ORAL | Status: AC | PRN

## 2021-04-23 MED ORDER — SOD CITRATE-CITRIC ACID 500-334 MG/5ML PO SOLN
ORAL | Status: AC
  Administered 2021-04-23: 30 mL via ORAL
  Filled 2021-04-23: qty 15

## 2021-04-23 MED ORDER — BUPIVACAINE HCL (PF) 0.5 % IJ SOLN
INTRAMUSCULAR | Status: AC
  Administered 2021-04-23: 5 mL
  Filled 2021-04-23: qty 30

## 2021-04-23 MED ORDER — SOD CITRATE-CITRIC ACID 500-334 MG/5ML PO SOLN: 30.0000 mL | ORAL | Status: AC

## 2021-04-23 MED ORDER — CLINDAMYCIN PHOSPHATE 900 MG/50ML IV SOLN
INTRAVENOUS | Status: AC
  Filled 2021-04-23: qty 50

## 2021-04-23 MED ORDER — MENTHOL 3 MG MT LOZG
1.0000 | LOZENGE | OROMUCOSAL | Status: DC | PRN
  Filled 2021-04-23: qty 9

## 2021-04-23 MED ORDER — ONDANSETRON HCL 4 MG/2ML IJ SOLN
INTRAMUSCULAR | Status: DC | PRN
  Administered 2021-04-23: 4 mg via INTRAVENOUS

## 2021-04-23 MED ORDER — CEFAZOLIN SODIUM-DEXTROSE 2-4 GM/100ML-% IV SOLN
INTRAVENOUS | Status: AC
  Filled 2021-04-23: qty 100

## 2021-04-23 MED ORDER — COCONUT OIL OIL
1.0000 "application " | TOPICAL_OIL | Status: DC | PRN
  Filled 2021-04-23 (×2): qty 120

## 2021-04-23 MED ORDER — WITCH HAZEL-GLYCERIN EX PADS: 1.0000 "application " | MEDICATED_PAD | CUTANEOUS | Status: DC | PRN

## 2021-04-23 MED ORDER — PHENYLEPHRINE HCL (PRESSORS) 10 MG/ML IV SOLN
INTRAVENOUS | Status: DC | PRN
  Administered 2021-04-23: 50 ug via INTRAVENOUS

## 2021-04-23 MED ORDER — OXYTOCIN-SODIUM CHLORIDE 30-0.9 UT/500ML-% IV SOLN
INTRAVENOUS | Status: AC
  Filled 2021-04-23: qty 500

## 2021-04-23 MED ORDER — LACTATED RINGERS IV SOLN: INTRAVENOUS | Status: DC

## 2021-04-23 MED ORDER — ONDANSETRON HCL 4 MG/2ML IJ SOLN: 4.0000 mg | Freq: Three times a day (TID) | INTRAMUSCULAR | Status: DC | PRN

## 2021-04-23 MED ORDER — BUPIVACAINE HCL (PF) 0.5 % IJ SOLN
5.0000 mL | Freq: Once | INTRAMUSCULAR | Status: DC
  Administered 2021-04-23: 5 mL

## 2021-04-23 MED ORDER — DIBUCAINE (PERIANAL) 1 % EX OINT: 1.0000 "application " | TOPICAL_OINTMENT | CUTANEOUS | Status: DC | PRN

## 2021-04-23 MED ORDER — KETOROLAC TROMETHAMINE 30 MG/ML IJ SOLN: 30.0000 mg | Freq: Four times a day (QID) | INTRAMUSCULAR | Status: AC

## 2021-04-23 MED ORDER — FENTANYL CITRATE (PF) 100 MCG/2ML IJ SOLN
INTRAMUSCULAR | Status: AC
  Filled 2021-04-23: qty 2

## 2021-04-23 MED ORDER — FERROUS SULFATE 325 (65 FE) MG PO TABS
325.0000 mg | ORAL_TABLET | Freq: Two times a day (BID) | ORAL | Status: DC
  Administered 2021-04-23 – 2021-04-26 (×7): 325 mg via ORAL
  Filled 2021-04-23 (×7): qty 1

## 2021-04-23 MED ORDER — OXYTOCIN-SODIUM CHLORIDE 30-0.9 UT/500ML-% IV SOLN
2.5000 [IU]/h | INTRAVENOUS | Status: AC
  Administered 2021-04-23 – 2021-04-24 (×2): 2.5 [IU]/h via INTRAVENOUS
  Filled 2021-04-23: qty 500

## 2021-04-23 MED ORDER — LACTATED RINGERS IV BOLUS
1000.0000 mL | Freq: Once | INTRAVENOUS | Status: AC
  Administered 2021-04-23: 1000 mL via INTRAVENOUS

## 2021-04-23 MED ORDER — CLINDAMYCIN PHOSPHATE 900 MG/50ML IV SOLN
900.0000 mg | INTRAVENOUS | Status: AC
  Administered 2021-04-23: 900 mg via INTRAVENOUS

## 2021-04-23 MED ORDER — BUPIVACAINE IN DEXTROSE 0.75-8.25 % IT SOLN
INTRATHECAL | Status: DC | PRN
  Administered 2021-04-23: 1.5 mL via INTRATHECAL

## 2021-04-23 MED ORDER — GENTAMICIN SULFATE 40 MG/ML IJ SOLN
5.0000 mg/kg | INTRAVENOUS | Status: AC
  Administered 2021-04-23: 310 mg via INTRAVENOUS
  Filled 2021-04-23: qty 7.75

## 2021-04-23 MED ORDER — OXYTOCIN-SODIUM CHLORIDE 30-0.9 UT/500ML-% IV SOLN
INTRAVENOUS | Status: DC | PRN
  Administered 2021-04-23: 500 mL via INTRAVENOUS

## 2021-04-23 MED ORDER — KETOROLAC TROMETHAMINE 30 MG/ML IJ SOLN
INTRAMUSCULAR | Status: DC | PRN
  Administered 2021-04-23: 30 mg via INTRAVENOUS

## 2021-04-23 MED ORDER — MORPHINE SULFATE (PF) 0.5 MG/ML IJ SOLN
INTRAMUSCULAR | Status: DC | PRN
  Administered 2021-04-23: 100 ug via INTRATHECAL

## 2021-04-23 MED ORDER — MORPHINE SULFATE (PF) 0.5 MG/ML IJ SOLN
INTRAMUSCULAR | Status: AC
  Filled 2021-04-23: qty 10

## 2021-04-23 MED ORDER — SENNOSIDES-DOCUSATE SODIUM 8.6-50 MG PO TABS
2.0000 | ORAL_TABLET | ORAL | Status: DC
  Administered 2021-04-23 – 2021-04-26 (×4): 2 via ORAL
  Filled 2021-04-23 (×4): qty 2

## 2021-04-23 MED ORDER — ACETAMINOPHEN 325 MG PO TABS
650.0000 mg | ORAL_TABLET | Freq: Four times a day (QID) | ORAL | Status: AC
  Administered 2021-04-23 – 2021-04-24 (×4): 650 mg via ORAL
  Filled 2021-04-23 (×4): qty 2

## 2021-04-23 MED ORDER — SIMETHICONE 80 MG PO CHEW
80.0000 mg | CHEWABLE_TABLET | Freq: Three times a day (TID) | ORAL | Status: DC
  Administered 2021-04-23 – 2021-04-26 (×10): 80 mg via ORAL
  Filled 2021-04-23 (×10): qty 1

## 2021-04-23 MED ORDER — DIPHENHYDRAMINE HCL 25 MG PO CAPS: 25.0000 mg | ORAL_CAPSULE | Freq: Four times a day (QID) | ORAL | Status: DC | PRN

## 2021-04-23 MED ORDER — BUPIVACAINE HCL (PF) 0.5 % IJ SOLN
INTRAMUSCULAR | Status: DC | PRN
  Administered 2021-04-23: 10 mL

## 2021-04-23 MED ORDER — BUPIVACAINE 0.25 % ON-Q PUMP DUAL CATH 400 ML
400.0000 mL | INJECTION | Status: DC
  Filled 2021-04-23: qty 400

## 2021-04-23 MED ORDER — GLYCOPYRROLATE 0.2 MG/ML IJ SOLN
INTRAMUSCULAR | Status: DC | PRN
  Administered 2021-04-23: .2 mg via INTRAVENOUS

## 2021-04-23 MED ORDER — SODIUM CHLORIDE 0.9 % IV SOLN
INTRAVENOUS | Status: DC | PRN
  Administered 2021-04-23: 40 ug/min via INTRAVENOUS

## 2021-04-23 SURGICAL SUPPLY — 39 items
ADH SKN CLS APL DERMABOND .7 (GAUZE/BANDAGES/DRESSINGS) ×1
ADH SKN CLS LQ APL DERMABOND (GAUZE/BANDAGES/DRESSINGS) ×1
BACTOSHIELD CHG 4% 4OZ (MISCELLANEOUS) ×1
CATH KIT ON-Q SILVERSOAK 5 (CATHETERS) ×2 IMPLANT
CATH KIT ON-Q SILVERSOAK 5IN (CATHETERS) ×4 IMPLANT
DERMABOND ADHESIVE PROPEN (GAUZE/BANDAGES/DRESSINGS) ×1
DERMABOND ADVANCED (GAUZE/BANDAGES/DRESSINGS) ×1
DERMABOND ADVANCED .7 DNX12 (GAUZE/BANDAGES/DRESSINGS) ×1 IMPLANT
DERMABOND ADVANCED .7 DNX6 (GAUZE/BANDAGES/DRESSINGS) IMPLANT
DRSG OPSITE POSTOP 4X10 (GAUZE/BANDAGES/DRESSINGS) ×2 IMPLANT
DRSG TEGADERM 2-3/8X2-3/4 SM (GAUZE/BANDAGES/DRESSINGS) ×1 IMPLANT
DRSG TELFA 3X8 NADH (GAUZE/BANDAGES/DRESSINGS) ×2 IMPLANT
ELECT CAUTERY BLADE 6.4 (BLADE) ×2 IMPLANT
ELECT REM PT RETURN 9FT ADLT (ELECTROSURGICAL) ×2
ELECTRODE REM PT RTRN 9FT ADLT (ELECTROSURGICAL) ×1 IMPLANT
GAUZE SPONGE 4X4 12PLY STRL (GAUZE/BANDAGES/DRESSINGS) ×2 IMPLANT
GLOVE SURG ENC MOIS LTX SZ7 (GLOVE) ×2 IMPLANT
GLOVE SURG UNDER LTX SZ7.5 (GLOVE) ×2 IMPLANT
GOWN STRL REUS W/ TWL LRG LVL3 (GOWN DISPOSABLE) ×3 IMPLANT
GOWN STRL REUS W/TWL LRG LVL3 (GOWN DISPOSABLE) ×6
MANIFOLD NEPTUNE II (INSTRUMENTS) ×2 IMPLANT
MAT PREVALON FULL STRYKER (MISCELLANEOUS) ×2 IMPLANT
NS IRRIG 1000ML POUR BTL (IV SOLUTION) ×2 IMPLANT
PACK C SECTION AR (MISCELLANEOUS) ×2 IMPLANT
PAD DRESSING TELFA 3X8 NADH (GAUZE/BANDAGES/DRESSINGS) ×1 IMPLANT
PAD OB MATERNITY 4.3X12.25 (PERSONAL CARE ITEMS) ×4 IMPLANT
PAD PREP 24X41 OB/GYN DISP (PERSONAL CARE ITEMS) ×2 IMPLANT
PENCIL SMOKE EVACUATOR (MISCELLANEOUS) ×2 IMPLANT
SCRUB CHG 4% DYNA-HEX 4OZ (MISCELLANEOUS) ×1 IMPLANT
STRIP CLOSURE SKIN 1/2X4 (GAUZE/BANDAGES/DRESSINGS) ×2 IMPLANT
SUT MNCRL 4-0 (SUTURE) ×2
SUT MNCRL 4-0 27XMFL (SUTURE) ×1
SUT PDS AB 1 TP1 96 (SUTURE) ×2 IMPLANT
SUT PLAIN GUT 0 (SUTURE) IMPLANT
SUT VIC AB 0 CTX 36 (SUTURE) ×6
SUT VIC AB 0 CTX36XBRD ANBCTRL (SUTURE) ×2 IMPLANT
SUT VICRYL 3-0 27IN SH (SUTURE) ×1 IMPLANT
SUTURE MNCRL 4-0 27XMF (SUTURE) ×1 IMPLANT
SWABSTK COMLB BENZOIN TINCTURE (MISCELLANEOUS) ×2 IMPLANT

## 2021-04-23 NOTE — Anesthesia Procedure Notes (Signed)
Spinal  Patient location during procedure: OR Start time: 04/23/2021 1:42 PM End time: 04/23/2021 1:47 PM Reason for block: surgical anesthesia Staffing Anesthesiologist: Phill Mutter, MD Resident/CRNA: Daryel Gerald, RN Preanesthetic Checklist Completed: patient identified, IV checked, site marked, risks and benefits discussed, surgical consent, monitors and equipment checked, pre-op evaluation and timeout performed Spinal Block Patient position: sitting Prep: Betadine Patient monitoring: heart rate, continuous pulse ox, blood pressure and cardiac monitor Approach: midline Location: L3-4 Injection technique: single-shot Needle Needle type: Whitacre and Introducer  Needle gauge: 24 G Needle length: 9 cm Assessment Sensory level: T4 Events: CSF return Additional Notes Negative paresthesia. Negative blood return. Positive free-flowing CSF. Expiration date of kit checked and confirmed. Patient tolerated procedure well, without complications.

## 2021-04-23 NOTE — Anesthesia Postprocedure Evaluation (Signed)
Anesthesia Post Note  Patient: Carolyn Harrison  Procedure(s) Performed: CESAREAN SECTION  Patient location during evaluation: PACU Anesthesia Type: Spinal Level of consciousness: awake and alert, awake and oriented Pain management: pain level controlled Vital Signs Assessment: post-procedure vital signs reviewed and stable Respiratory status: spontaneous breathing, nonlabored ventilation and respiratory function stable Cardiovascular status: blood pressure returned to baseline and stable Postop Assessment: no apparent nausea or vomiting Anesthetic complications: no   No notable events documented.   Last Vitals:  Vitals:   04/23/21 1803 04/23/21 1804  BP:    Pulse: 76 72  Resp:    Temp:    SpO2: 97% 98%    Last Pain:  Vitals:   04/23/21 1802  TempSrc: Oral  PainSc:                  Manfred Arch

## 2021-04-23 NOTE — H&P (Signed)
OB History & Physical   History of Present Illness:  Chief Complaint: regular uterine contractions  HPI:  Carolyn Harrison is a 35 y.o. [id number] female at 32w6ddated by LMP consistent with 8 week ultrasound.  Her pregnancy has been complicated by diet controlled gestational diabetes, history of cesarean delivery s/p successful VBAC, advanced maternal Harrison, malpresentation of fetus .    She reports painful contractions.   She denies leakage of fluid.   She denies vaginal bleeding.   She reports fetal movement.    Total weight gain for pregnancy: 6.35 kg   Obstetrical Problem List: FOURTH Problems (from 09/25/20 to present)     Problem Noted Resolved   Gestational diabetes mellitus (GDM) affecting fourth pregnancy 04/20/2021 by GRod Can CNM No   Anemia affecting pregnancy in third trimester 03/23/2021 by SMalachy Mood MD No   History of preterm delivery, currently pregnant 10/10/2020 by JWill Bonnet MD No   Supervision of high-risk pregnancy, third trimester 09/25/2020 by GRod Can CNM No   Overview Addendum 03/23/2021  2:01 PM by SMalachy Mood MWebberPrenatal Labs  Dating LMP = 8 week UKoreaBlood type: O positive  Genetic Screen NIPS: normal XY Antibody:negative  [facility] [place] Rubella:Immune Varicella:Immune  GTT Early: Hgb A1C               Third trimester: 150 RPR:NR  Rhogam  n/a HBsAg: negative   Vaccines TDAP:                       Flu Shot: Covid: HGYI:RSWNIOEV Baby Food breast                               GBS:   GC/CT: neg / neg  Contraception  Pap: 2019 negative  CBB   AMA  CS/VBAC G2 in 2011 (breech), G3 VBAC 2016   Support Person TMarcello Moores             Maternal Medical History:   Past Medical History:  Diagnosis Date   Anxiety    Breech presentation 08/15/2010   Gestational diabetes    History of Helicobacter pylori infection    2015   History of Papanicolaou smear of cervix 07/09/2011; 03/13/15    NEG;ASCUS, HPV -;   Rh(D) positive    Spontaneous abortion 2010    Past Surgical History:  Procedure Laterality Date   CESAREAN SECTION  08/25/2010   BREECH PRESENTATION   GANGLION CYST EXCISION Right 2011   INTRAUTERINE DEVICE (IUD) INSERTION  07/12/2012   TONSILLECTOMY  1994   UPPER GI ENDOSCOPY  12/2013   SL INFLAMMATION OF STOMACH LINING TESTED POS FOR H PYLORI    Allergies  Allergen Reactions   Keflex [Cephalexin]     Prior to Admission medications   Medication Sig Start Date End Date Taking? Authorizing Provider  Accu-Chek Softclix Lancets lancets Use as directed. 04/20/21  Yes GRod Can CNM  blood glucose meter kit and supplies KIT Dispense based on patient and insurance preference. Use up to four times daily as directed. Check blood sugar 4 times daily; fasting and 2 hours after each meal 04/14/21  Yes GRod Can CNM  Blood Glucose Monitoring Suppl (ACCU-CHEK NANO SMARTVIEW) w/Device KIT 1 kit by Subdermal route as directed. Check blood sugars for fasting, and two hours after breakfast, lunch and dinner (4 checks daily) 04/20/21  Yes Rod Can, CNM  Cetirizine HCl (ZYRTEC PO) Take by mouth.   Yes [provider]  ferrous sulfate (FERROUSUL) 325 (65 FE) MG tablet Take 1 tablet (325 mg total) by mouth daily with breakfast. 03/23/21  Yes Malachy Mood, MD  glucose blood (ACCU-CHEK SMARTVIEW) test strip Use as instructed to check blood sugars 04/20/21  Yes Rod Can, CNM  Prenatal Vit-Fe Fumarate-FA (MULTIVITAMIN-PRENATAL) 27-0.8 MG TABS tablet Take 1 tablet by mouth daily at 12 noon.   Yes [provider]  FLUoxetine (PROZAC) 40 MG capsule Take 1 capsule (40 mg total) by mouth daily. Patient not taking: Reported on 04/23/2021 03/05/21   Orlie Pollen, CNM  hydrOXYzine (ATARAX/VISTARIL) 25 MG tablet Take 1 tablet (25 mg total) by mouth 3 (three) times daily as needed. Patient not taking: Reported on 04/07/2021 08/21/18   Volney American,  PA-C    OB History  Carolyn Harrison Para Term Preterm AB Living  _0 SAB IAB Ectopic Multiple Live Births  1       2    # Outcome Date GA Lbr Len/2nd Weight Sex Delivery Anes PTL Lv  4 Current           3 Preterm 01/22/15 [redacted]w[redacted]d 3232 g M VBAC   LIV  2 Term 08/25/10 34w0d3204 g F CS-LTranv   LIV     Birth Comments: BREECH PRESENTATION  1 SAB 09/29/09 7w9w0d        Prenatal care site: [facility]  Social History: She  reports that she has never smoked. She has never used smokeless tobacco. She reports that she does not drink alcohol and does not use drugs.  Family History: family history includes Cancer in her paternal grandmother; Diabetes in her maternal grandmother; Ulcers in her mother.   Review of Systems:  Review of Systems  Constitutional: Negative.   HENT: Negative.    Eyes: Negative.   Respiratory: Negative.    Cardiovascular: Negative.   Gastrointestinal:  Positive for abdominal pain (contractions).  Genitourinary: Negative.   Musculoskeletal: Negative.   Skin: Negative.   Neurological: Negative.   Psychiatric/Behavioral: Negative.      Physical Exam:  BP 134/75 (BP Location: Left Arm)   Pulse 87   Temp 97.8 F (36.6 C) (Oral)   Resp 18   Ht _1  (1.549 m)   Wt 81.2 kg   LMP 08/15/2020 (Exact Date)   SpO2 100%   BMI 33.82 kg/m   Physical Exam Constitutional:      General: She is not in acute distress.    Appearance: Normal appearance. She is well-developed.  Genitourinary:     Genitourinary Comments: Initial 4 cm, changed to 5 cm over 2 hours  HENT:     Head: Normocephalic and atraumatic.  Eyes:     General: No scleral icterus.    Conjunctiva/sclera: Conjunctivae normal.  Cardiovascular:     Rate and Rhythm: Normal rate and regular rhythm.     Heart sounds: No murmur heard.   No friction rub. No gallop.  Pulmonary:     Effort: Pulmonary effort is normal. No respiratory distress.     Breath sounds: Normal breath sounds. No wheezing or  rales.  Abdominal:     General: Bowel sounds are normal. There is no distension.     Palpations: Abdomen is soft. There is mass (gravid NT).     Tenderness: There is no abdominal tenderness. There is no guarding  or rebound.  Musculoskeletal:        General: Normal range of motion.     Cervical back: Normal range of motion and neck supple.  Neurological:     General: No focal deficit present.     Mental Status: She is alert and oriented to person, place, and time.     Cranial Nerves: No cranial nerve deficit.  Skin:    General: Skin is warm and dry.     Findings: No erythema.  Psychiatric:        Mood and Affect: Mood normal.        Behavior: Behavior normal.        Judgment: Judgment normal.    Female chaperone present for pelvic exam:   Baseline FHR: 135 beats/min   Variability: moderate   Accelerations: present   Decelerations: absent Contractions: present frequency: 3 q 10 min Overall assessment: cat 1  Bedside Ultrasound:  Number of Fetus: 1  Presentation: breech, (appears to also be funic presentation)  Fluid: subjectively normal  Placental Location: anterior  Lab Results  Component Value Date   [place] NEGATIVE 04/23/2021    Assessment:  Carolyn Harrison is a 35 y.o. V4H6067 female at 38w6dwith active labor, breech presentation, history of prior cesarean section.   Plan:  Admit to Labor & Delivery  CBC, T&S, Clrs, IVF GBS unknown.   Fetwal well-being: reassuring Patient is in active labor with breech presentation (palpated likely umbilical cord vaginally).  Will move to delivery by repeat c-section.  Patient voiced understanding and agreement of plan.  I personally consented patient for procedure.    SPrentice Docker MD 04/23/2021 12:52 PM

## 2021-04-23 NOTE — OB Triage Note (Signed)
Pt presents c/o ctx since early this morning. Pt states the ctx woke her up and have been getting more intense and frequent. Pt denies bleeding or LOF. Reports positive fetal movement. VSS. Will continue to monitor.

## 2021-04-23 NOTE — Telephone Encounter (Signed)
Pt calling; is [redacted]w[redacted]d; started having ctxs early this am every lasting one minute; they are not lessening; baby is breech; L&D or wait.  [phone]  Pt aware to go to L&D via ED.  Kelly aware.

## 2021-04-23 NOTE — Transfer of Care (Signed)
Immediate Anesthesia Transfer of Care Note  Patient: Carolyn Harrison  Procedure(s) Performed: CESAREAN SECTION  Patient Location: Mother/Baby  Anesthesia Type:Spinal  Level of Consciousness: awake, alert  and oriented  Airway & Oxygen Therapy: Patient Spontanous Breathing  Post-op Assessment: Report given to RN and Post -op Vital signs reviewed and stable  Post vital signs: Reviewed and stable  Last Vitals:  Vitals Value Taken Time  BP 108/66   Temp    Pulse 65   Resp 14   SpO2 97     Last Pain:  Vitals:   04/23/21 1024  TempSrc: Oral  PainSc: 6       Patients Stated Pain Goal: 0 (04/23/21 1024)  Complications: No notable events documented.

## 2021-04-23 NOTE — Anesthesia Procedure Notes (Signed)
Date/Time: 04/23/2021 1:58 PM Performed by: Irving Burton, CRNA Pre-anesthesia Checklist: Patient identified, Emergency Drugs available, Suction available, Patient being monitored and Timeout performed Patient Re-evaluated:Patient Re-evaluated prior to induction Oxygen Delivery Method: Nasal cannula Induction Type: IV induction

## 2021-04-23 NOTE — Discharge Summary (Signed)
   Postpartum Discharge Summary    Patient Name: Carolyn Harrison DOB: 02/27/1986 MRN: [id number]  Date of admission: 04/23/2021 Delivery date:04/23/2021  Delivering provider: JACKSON, STEPHEN D  Date of discharge: 04/26/2021  Admitting diagnosis: Malpresentation of fetus, antepartum [O32.9XX0] Intrauterine pregnancy: [redacted]w[redacted]d     Secondary diagnosis:  Principal Problem:   Supervision of high-risk pregnancy, third trimester Active Problems:   Depression   Multigravida of advanced maternal age, third trimester   History of preterm delivery, currently pregnant   Anemia affecting pregnancy in third trimester   Gestational diabetes mellitus (GDM) affecting fourth pregnancy   Malpresentation of fetus, antepartum   History of cesarean section   [redacted] weeks gestation of pregnancy   Encounter for care or examination of lactating mother  Additional problems: none    Discharge diagnosis: Preterm Pregnancy Delivered                                              Post partum procedures: none Augmentation: N/A Complications: None  Hospital course: Onset of Labor With C/S due to malpresentation of fetus   35 y.o. yo G4P1213 at [redacted]w[redacted]d was admitted in Active Labor on 04/23/2021. Patient had a labor course significant for active labor with an ultrasound-verified complete breech fetus with umbilical cord presenting. The patient went for cesarean section due to Malpresentation. Delivery details as follows: Membrane Rupture Time/Date: 2:06 PM ,04/23/2021   Delivery Method:C-Section, Low Transverse  Details of operation can be found in separate operative note. Patient had an uncomplicated postpartum course.  She is ambulating,tolerating a regular diet, passing flatus, and urinating well.  Patient is discharged home in stable condition 04/26/21. Her EPDS is elevated due to newborn in SCN and not ready for discharge.   Newborn Data: Birth date:04/23/2021  Birth time:2:08 PM  Gender:Female  Living status:Living   Apgars:1 ,6  Weight:3140 g   Magnesium Sulfate received: No BMZ received: No Rhophylac:N/A MMR:N/A T-DaP:Given postpartum Flu: No Transfusion:No  Physical exam  Vitals:   04/25/21 1626 04/25/21 1950 04/25/21 2310 04/26/21 0824  BP: 113/65 119/73 131/79 108/80  Pulse: 75 81 74 70  Resp: 18 20 20 18  Temp: 98.2 F (36.8 C) 98.3 F (36.8 C) 98.4 F (36.9 C) 98.1 F (36.7 C)  TempSrc: Oral Oral Oral Oral  SpO2: 99% 95% 98% 99%  Weight:      Height:       General: alert, cooperative, and no distress Lochia: appropriate Uterine Fundus: firm Incision: Healing well with no significant drainage, Dressing is clean, dry, and intact, on q intact DVT Evaluation: No evidence of DVT seen on physical exam. Labs: Lab Results  Component Value Date   WBC 9.4 04/25/2021   HGB 8.5 (L) 04/25/2021   HCT 25.5 (L) 04/25/2021   MCV 92.7 04/25/2021   PLT 118 (L) 04/25/2021   CMP Latest Ref Rng & Units 04/04/2020  Glucose 65 - 99 mg/dL 90  BUN 6 - 20 mg/dL 11  Creatinine 0.57 - 1.00 mg/dL 0.92  Sodium 134 - 144 mmol/L 138  Potassium 3.5 - 5.2 mmol/L 4.0  Chloride 96 - 106 mmol/L 102  CO2 20 - 29 mmol/L 26  Calcium 8.7 - 10.2 mg/dL 8.9  Total Protein 6.0 - 8.5 g/dL 6.6  Total Bilirubin 0.0 - 1.2 mg/dL 0.3  Alkaline Phos 48 - 121 IU/L 88  AST 0 -   40 IU/L 17  ALT 0 - 32 IU/L 15   Edinburgh Score: Edinburgh Postnatal Depression Scale Screening Tool 04/24/2021  I have been able to laugh and see the funny side of things. 0  I have looked forward with enjoyment to things. 0  I have blamed myself unnecessarily when things went wrong. 2  I have been anxious or worried for no good reason. 2  I have felt scared or panicky for no good reason. 2  Things have been getting on top of me. 1  I have been so unhappy that I have had difficulty sleeping. 2  I have felt sad or miserable. 1  I have been so unhappy that I have been crying. 1  The thought of harming myself has occurred to me. 0   Edinburgh Postnatal Depression Scale Total 11      After visit meds:  Allergies as of 04/26/2021       Reactions   Keflex [cephalexin]         Medication List     STOP taking these medications    Accu-Chek Nano SmartView w/Device Kit   Accu-Chek SmartView test strip Generic drug: glucose blood   Accu-Chek Softclix Lancets lancets   blood glucose meter kit and supplies Kit   hydrOXYzine 25 MG tablet Commonly known as: ATARAX/VISTARIL       TAKE these medications    ferrous sulfate 325 (65 FE) MG tablet Commonly known as: FerrouSul Take 1 tablet (325 mg total) by mouth daily with breakfast.   FLUoxetine 20 MG capsule Commonly known as: PROZAC Take 1 capsule (20 mg total) by mouth daily. Start taking on: April 27, 2021 What changed:  medication strength how much to take   multivitamin-prenatal 27-0.8 MG Tabs tablet Take 1 tablet by mouth daily at 12 noon.   oxyCODONE 5 MG immediate release tablet Commonly known as: Roxicodone Take 1 tablet (5 mg total) by mouth every 6 (six) hours as needed for up to 5 days for severe pain.   ZYRTEC PO Take by mouth.               Discharge Care Instructions  (From admission, onward)           Start     Ordered   04/26/21 0000  Discharge wound care:       Comments: Keep incision dry, clean.   04/26/21 1021             Discharge home in stable condition Infant Feeding:  pumping, baby in SCN with tube feeds Infant Disposition:NICU Discharge instruction: per After Visit Summary and Postpartum booklet. Activity: Advance as tolerated. Pelvic rest for 6 weeks.  Diet: routine diet Anticipated Birth Control: IUD Postpartum Appointment:1 week Additional Postpartum F/U: Incision check 1 week and mood check Future Appointments: Future Appointments  Date Time Provider [place]  04/29/2021  3:50 PM Rod Can, CNM [facility] None   Follow up Visit:  Follow-up Information     Will Bonnet, MD. Schedule an appointment as soon as possible for a visit in 1 week(s).   Specialty: Obstetrics and Gynecology Why: For incision check and mood check Contact information: [address] [place] [address] [phone]                 SIGNED: Christean Leaf, CNM [facility] 04/26/2021, 10:25 AM

## 2021-04-23 NOTE — Op Note (Signed)
Cesarean Section Operative Note    Patient Name: Carolyn Harrison  MRN: [id number]  Date of Surgery: 04/23/2021   Pre-operative Diagnosis:  1) History of cesarean section 2) normal labor 3) Malpresentation of fetus, antepartum third trimester 4) intrauterine pregnancy at [redacted]w[redacted]d   Post-operative Diagnosis:  1) History of cesarean section 2) normal labor 3) Malpresentation of fetus, antepartum third trimester 4) intrauterine pregnancy at [redacted]w[redacted]d    Procedure: Repeat low transverse cesarean section via Pfannenstiel incision with double layer uterine closure  Surgeon: Surgeon(s) and Role:    Conard Novak, MD - Primary  Assistants: Dr. Lebron Conners; No other capable assistant available, in surgery requiring high level assistant.  Anesthesia: spinal   Findings:  1) normal appearing gravid uterus, fallopian tubes, and ovaries 2) viable female infant with weight 3,140 grams, APGARs 1, 6, and 7   Quantified Blood Loss: 550 mL  Total IV Fluids: 1,000 ml   Urine Output:  350 mL clear urine at end of procedure  Specimens: none  Complications: no complications  Disposition: PACU - hemodynamically stable.   Maternal Condition: stable   Baby condition / location:  NICU  Procedure Details:  The patient was seen in the Holding Room. The risks, benefits, complications, treatment options, and expected outcomes were discussed with the patient. The patient concurred with the proposed plan, giving informed consent. identified as [name] and the procedure verified as C-Section Delivery. A Time Out was held and the above information confirmed.   After induction of anesthesia, the patient was draped and prepped in the usual sterile manner. A Pfannenstiel incision was made and carried down through the subcutaneous tissue to the fascia. Fascial incision was made and extended transversely. The fascia was separated from the underlying rectus tissue superiorly and inferiorly. The  peritoneum was identified and entered. Peritoneal incision was extended longitudinally. The bladder flap was not bluntly or sharply freed from the lower uterine segment. A low transverse uterine incision was made and the hysterotomy was extended with cranial-caudal tension. Delivered from breech (complete) presentation was a 3,140 gram Living newborn infant(s) or Female with Apgar scores of 1 at one minute and 6 at five minutes, 7 at 10 minutes. Cord ph was not sent the umbilical cord was clamped and cut cord blood was obtained for evaluation. The placenta was removed Intact and appeared normal. The uterine outline, tubes and ovaries appeared normal. The uterine incision was closed with running locked sutures of 0 Vicryl.  A second layer of the same suture was thrown in an imbricating fashion.  Hemostasis was assured.  The uterus was returned to the abdomen and the paracolic gutters were cleared of all clots and debris.  The rectus muscles were inspected and found to be hemostatic.  The On-Q catheter pumps were inserted in accordance with the manufacturer's recommendations.  The catheters were inserted approximately 4cm cephelad to the incision line, approximately 1cm apart, straddling the midline.  They were inserted to a depth of the 4th mark. They were positioned superficial to the rectus abdominus muscles and deep to the rectus fascia.    The fascia was then reapproximated with running sutures of 1-0 PDS, looped. A 3-0 Vicryl suture was used to re-approximate the subcutaneous space. The subcuticular closure was performed using 4-0 monocryl. The skin closure was reinforced using surgical skin glue.  The On-Q catheters were bolused with 5 mL of 0.5% marcaine plain for a total of 10 mL.  The catheters were affixed to the  skin with surgical skin glue, steri-strips, and tegaderm.    The surgical assistant performed tissue retraction, assistance with suturing, and fundal pressure.    Instrument, sponge, and  needle counts were correct prior the abdominal closure and were correct at the conclusion of the case.  The patient received Gentamicin and Clindamycin IV given prior to skin incision (within 30 minutes). For VTE prophylaxis she was wearing SCDs throughout the case.  The assistant surgeon was an MD due to lack of availability of another Sales promotion account executive.    Signed: Conard Novak, MD 04/23/2021 3:21 PM

## 2021-04-23 NOTE — Anesthesia Preprocedure Evaluation (Addendum)
Anesthesia Evaluation  Patient identified by MRN, date of birth, ID band Patient awake    Reviewed: Allergy & Precautions, NPO status , Patient's Chart, lab work & pertinent test results  Airway Mallampati: II  TM Distance: >3 FB Neck ROM: Full    Dental no notable dental hx.    Pulmonary neg pulmonary ROS,    Pulmonary exam normal        Cardiovascular negative cardio ROS Normal cardiovascular exam     Neuro/Psych PSYCHIATRIC DISORDERS Anxiety Depression negative neurological ROS     GI/Hepatic negative GI ROS, Neg liver ROS,   Endo/Other  negative endocrine ROSdiabetes, Well Controlled, Gestational  Renal/GU negative Renal ROS  negative genitourinary   Musculoskeletal negative musculoskeletal ROS (+)   Abdominal   Peds negative pediatric ROS (+)  Hematology negative hematology ROS (+) anemia ,   Anesthesia Other Findings . Anxiety  . Breech presentation 08/15/2010 . Gestational diabetes  . History of Helicobacter pylori infection   2015 . History of Papanicolaou smear of cervix     Reproductive/Obstetrics negative OB ROS                            Anesthesia Physical Anesthesia Plan  ASA: 2 and emergent  Anesthesia Plan: Spinal   Post-op Pain Management:    Induction: Intravenous  PONV Risk Score and Plan: 2 and Ondansetron  Airway Management Planned: Simple Face Mask  Additional Equipment:   Intra-op Plan:   Post-operative Plan:   Informed Consent: I have reviewed the patients History and Physical, chart, labs and discussed the procedure including the risks, benefits and alternatives for the proposed anesthesia with the patient or authorized representative who has indicated his/her understanding and acceptance.       Plan Discussed with: CRNA, Anesthesiologist and Surgeon  Anesthesia Plan Comments:         Anesthesia Quick Evaluation

## 2021-04-24 ENCOUNTER — Encounter: Payer: Self-pay | Admitting: Obstetrics and Gynecology

## 2021-04-24 LAB — CBC
HCT: 25.7 % — ABNORMAL LOW (ref 36.0–46.0)
Hemoglobin: 8.7 g/dL — ABNORMAL LOW (ref 12.0–15.0)
MCH: 31.5 pg (ref 26.0–34.0)
MCHC: 33.9 g/dL (ref 30.0–36.0)
MCV: 93.1 fL (ref 80.0–100.0)
Platelets: 114 10*3/uL — ABNORMAL LOW (ref 150–400)
RBC: 2.76 MIL/uL — ABNORMAL LOW (ref 3.87–5.11)
RDW: 14.3 % (ref 11.5–15.5)
WBC: 9.6 10*3/uL (ref 4.0–10.5)
nRBC: 0 % (ref 0.0–0.2)

## 2021-04-24 LAB — RPR: RPR Ser Ql: NONREACTIVE

## 2021-04-24 MED ORDER — LACTATED RINGERS IV BOLUS
1000.0000 mL | Freq: Once | INTRAVENOUS | Status: AC
  Administered 2021-04-24: 1000 mL via INTRAVENOUS

## 2021-04-24 MED ORDER — ACETAMINOPHEN 500 MG PO TABS
1000.0000 mg | ORAL_TABLET | Freq: Four times a day (QID) | ORAL | Status: DC | PRN
  Administered 2021-04-24 – 2021-04-25 (×2): 1000 mg via ORAL
  Filled 2021-04-24 (×2): qty 2

## 2021-04-24 MED ORDER — LACTATED RINGERS IV SOLN: INTRAVENOUS | Status: DC

## 2021-04-24 MED ORDER — IBUPROFEN 600 MG PO TABS
600.0000 mg | ORAL_TABLET | Freq: Four times a day (QID) | ORAL | Status: DC | PRN
Start: 2021-04-24 — End: 2021-04-27
  Administered 2021-04-25 – 2021-04-26 (×6): 600 mg via ORAL
  Filled 2021-04-24 (×6): qty 1

## 2021-04-24 NOTE — Lactation Note (Signed)
This note was copied from a baby's chart. Mom pumping breasts q 3h and obtained 8cc colostrum with last session, attempted feed at breast in SCN at 1400.

## 2021-04-24 NOTE — Progress Notes (Signed)
Subjective:   She reports that her pain is controlled.  She has not yet been able to void but the Foley was taken out 2 hours ago.  She is passing flatus and tolerating regular meal.  Her baby is in the special care nursery and was able to come off of CPAP today.  Objective:  Blood pressure 97/62, pulse 68, temperature 98.3 F (36.8 C), temperature source Oral, resp. rate 18, height 5\' 1"  (1.549 m), weight 81.2 kg, last menstrual period 08/15/2020, SpO2 97 %, unknown if currently breastfeeding.  General: NAD Pulmonary: no increased work of breathing Abdomen: non-distended, non-tender, fundus firm at level of umbilicus Incision: Clean dry intact Extremities: no edema, no erythema, no tenderness  Results for orders placed or performed during the hospital encounter of 04/23/21 (from the past 72 hour(s))  CBC     Status: Abnormal   Collection Time: 04/23/21 11:12 AM  Result Value Ref Range   WBC 12.6 (H) 4.0 - 10.5 K/uL   RBC 3.65 (L) 3.87 - 5.11 MIL/uL   Hemoglobin 11.2 (L) 12.0 - 15.0 g/dL   HCT 04/25/21 (L) 08.6 - 57.8 %   MCV 91.5 80.0 - 100.0 fL   MCH 30.7 26.0 - 34.0 pg   MCHC 33.5 30.0 - 36.0 g/dL   RDW 46.9 62.9 - 52.8 %   Platelets 138 (L) 150 - 400 K/uL   nRBC 0.0 0.0 - 0.2 %    Comment: Performed at [facility], [address], [place], [place]  Resp Panel by RT-PCR (Flu A&B, Covid) Nasopharyngeal Swab     Status: None   Collection Time: 04/23/21 11:12 AM   Specimen: Nasopharyngeal Swab; Nasopharyngeal(NP) swabs in vial transport medium  Result Value Ref Range   SARS Coronavirus 2 by RT PCR NEGATIVE NEGATIVE    Comment: (NOTE) SARS-CoV-2 target nucleic acids are NOT DETECTED.  The SARS-CoV-2 RNA is generally detectable in upper respiratory specimens during the acute phase of infection. The lowest concentration of SARS-CoV-2 viral copies this assay can detect is 138 copies/mL. A negative result does not preclude SARS-Cov-2 infection and should not be  used as the sole basis for treatment or other patient management decisions. A negative result may occur with  improper specimen collection/handling, submission of specimen other than nasopharyngeal swab, presence of viral mutation(s) within the areas targeted by this assay, and inadequate number of viral copies(<138 copies/mL). A negative result must be combined with clinical observations, patient history, and epidemiological information. The expected result is Negative.  Fact Sheet for Patients:  04/25/21  Fact Sheet for Healthcare Providers:  BloggerCourse.com  This test is no t yet approved or cleared by the SeriousBroker.it FDA and  has been authorized for detection and/or diagnosis of SARS-CoV-2 by FDA under an Emergency Use Authorization (EUA). This EUA will remain  in effect (meaning this test can be used) for the duration of the COVID-19 declaration under Section 564(b)(1) of the Act, 21 U.S.C.section 360bbb-3(b)(1), unless the authorization is terminated  or revoked sooner.       Influenza A by PCR NEGATIVE NEGATIVE   Influenza B by PCR NEGATIVE NEGATIVE    Comment: (NOTE) The Xpert Xpress SARS-CoV-2/FLU/RSV plus assay is intended as an aid in the diagnosis of influenza from Nasopharyngeal swab specimens and should not be used as a sole basis for treatment. Nasal washings and aspirates are unacceptable for Xpert Xpress SARS-CoV-2/FLU/RSV testing.  Fact Sheet for Patients: [place]  Fact Sheet for Healthcare Providers: BloggerCourse.com  This test is not yet approved or cleared by the [place] and has been authorized for detection and/or diagnosis of SARS-CoV-2 by FDA under an Emergency Use Authorization (EUA). This EUA will remain in effect (meaning this test can be used) for the duration of the COVID-19 declaration under Section 564(b)(1) of the  Act, 21 U.S.C. section 360bbb-3(b)(1), unless the authorization is terminated or revoked.  Performed at [facility], [address] [place], [place] [address]   Type and screen [facility]     Status: None   Collection Time: 04/23/21 11:12 AM  Result Value Ref Range   ABO/RH(D) O POS    Antibody Screen NEG    Sample Expiration      04/26/2021,2359 Performed at [facility], [address] [place], [place] [address]   RPR     Status: None   Collection Time: 04/23/21 11:12 AM  Result Value Ref Range   RPR Ser Ql NON REACTIVE NON REACTIVE    Comment: Performed at [facility], [address], [place], [place] [address]  Glucose, capillary     Status: None   Collection Time: 04/23/21 12:00 PM  Result Value Ref Range   Glucose-Capillary 92 70 - 99 mg/dL    Comment: Glucose reference range applies only to samples taken after fasting for at least 8 hours.   Comment 1 Notify RN   ABO/Rh     Status: None   Collection Time: 04/23/21  1:00 PM  Result Value Ref Range   ABO/RH(D)      O POS Performed at [facility], [address] [place], [place] [address]   Glucose, capillary     Status: Abnormal   Collection Time: 04/23/21  5:24 PM  Result Value Ref Range   Glucose-Capillary 130 (H) 70 - 99 mg/dL    Comment: Glucose reference range applies only to samples taken after fasting for at least 8 hours.  CBC     Status: Abnormal   Collection Time: 04/24/21  6:49 AM  Result Value Ref Range   WBC 9.6 4.0 - 10.5 K/uL   RBC 2.76 (L) 3.87 - 5.11 MIL/uL   Hemoglobin 8.7 (L) 12.0 - 15.0 g/dL   HCT 38.7 (L) 56.4 - 33.2 %   MCV 93.1 80.0 - 100.0 fL   MCH 31.5 26.0 - 34.0 pg   MCHC 33.9 30.0 - 36.0 g/dL   RDW 95.1 88.4 - 16.6 %   Platelets 114 (L) 150 - 400 K/uL    Comment: Immature Platelet Fraction may be clinically indicated, consider ordering this additional test [id number]    nRBC 0.0 0.0 - 0.2 %    Comment: Performed at  [facility], [address], [place], [place] [address]     Assessment:   35 y.o. [phone] postoperativeday # 1   Plan:  1) Acute blood loss anemia - hemodynamically stable and asymptomatic - po ferrous sulfate  2) Blood Type --/--/O POS Performed at [facility], [address] [place], [place] [address]  [phone])   3) Rubella 18.00 (01/21 0831) / Varicella immune  4) TDAP status - declined Immunization History  Administered Date(s) Administered   Influenza-Unspecified 07/31/2018, 08/29/2020   Moderna Sars-Covid-2 Vaccination 12/13/2019, 01/10/2020   Tdap 12/16/2013    5) Feeding- breast   6) Contraception - not discussed  7) Disposition - continue care

## 2021-04-25 LAB — CBC
HCT: 25.5 % — ABNORMAL LOW (ref 36.0–46.0)
Hemoglobin: 8.5 g/dL — ABNORMAL LOW (ref 12.0–15.0)
MCH: 30.9 pg (ref 26.0–34.0)
MCHC: 33.3 g/dL (ref 30.0–36.0)
MCV: 92.7 fL (ref 80.0–100.0)
Platelets: 118 10*3/uL — ABNORMAL LOW (ref 150–400)
RBC: 2.75 MIL/uL — ABNORMAL LOW (ref 3.87–5.11)
RDW: 14.6 % (ref 11.5–15.5)
WBC: 9.4 10*3/uL (ref 4.0–10.5)
nRBC: 0 % (ref 0.0–0.2)

## 2021-04-25 MED ORDER — FLUOXETINE HCL 10 MG PO CAPS
20.0000 mg | ORAL_CAPSULE | Freq: Every day | ORAL | Status: DC
  Administered 2021-04-25 – 2021-04-26 (×2): 20 mg via ORAL
  Filled 2021-04-25 (×2): qty 2

## 2021-04-25 NOTE — Progress Notes (Signed)
[facility] [address] [id number] [place] [address] Phone: [phone] Fax: [phone]  April 25, 2021  Patient: Carolyn Harrison  Date of Birth: August 24, 1986 Carolyn Sheldon)  Date of Visit: 04/23/2021    To Whom It May Concern:  [name], wife to [name], was seen and treated in our [facility] on 04/23/2021. She delivered by Cesarean Section on this date, and is in recovery mode at this time.  Sincerely,  Annamarie Major, MD [facility]

## 2021-04-25 NOTE — Progress Notes (Signed)
Admit Date: 04/23/2021 Today's Date: 04/25/2021  Subjective: Postpartum Day 2: Cesarean Delivery Patient reports tolerating PO, + flatus, and no problems voiding.    Objective: Vital signs in last 24 hours: Temp:  [97.8 F (36.6 C)-98.6 F (37 C)] 98.6 F (37 C) (07/23 0739) Pulse Rate:  [62-80] 80 (07/23 0739) Resp:  [18-20] 18 (07/23 0739) BP: (108-116)/(68-74) 108/74 (07/23 0739) SpO2:  [96 %-98 %] 96 % (07/23 0739)  Physical Exam:  General: alert, cooperative, and no distress Lochia: appropriate Uterine Fundus: firm Incision: healing well, no significant drainage, no dehiscence, no significant erythema DVT Evaluation: No evidence of DVT seen on physical exam. Negative Homan's sign. No cords or calf tenderness. No significant calf/ankle edema.  Recent Labs    04/24/21 0649 04/25/21 0432  HGB 8.7* 8.5*  HCT 25.7* 25.5*    Assessment/Plan: Status post Cesarean section. Doing well postoperatively.  Continue current care. 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

## 2021-04-26 MED ORDER — OXYCODONE HCL 5 MG PO TABS: 5.0000 mg | ORAL_TABLET | Freq: Four times a day (QID) | ORAL | 0 refills | Status: AC | PRN

## 2021-04-26 MED ORDER — FLUOXETINE HCL 20 MG PO CAPS: 20.0000 mg | ORAL_CAPSULE | Freq: Every day | ORAL | 11 refills | Status: DC

## 2021-04-26 MED ORDER — TETANUS-DIPHTH-ACELL PERTUSSIS 5-2.5-18.5 LF-MCG/0.5 IM SUSY
0.5000 mL | PREFILLED_SYRINGE | Freq: Once | INTRAMUSCULAR | Status: AC
  Administered 2021-04-26: 0.5 mL via INTRAMUSCULAR
  Filled 2021-04-26: qty 0.5

## 2021-04-26 NOTE — TOC Initial Note (Signed)
Transition of Care [facility]) - Progression Note    Patient Details  Name: LORNA STROTHER MRN: [id number] Date of Birth: December 09, 1985  Transition [facility]) CM/SW Contact  Marina Goodell Phone Number: [phone] 04/26/2021, 4:48 PM  Clinical Narrative:     CSW spoke with patient.  Patient was in room with her spouse Yianna Tersigni [phone].  Patient stated she has a good support system.  Patient will stay with the baby for the rest for the summer and an additional 12 weeks for maternity leave.  Patient stated she has private insurance and is already scheduled for follow-up appointments for her and the baby.  Patient stated she does take anxiety medication and has been approved by OBGYN to continue breast feeding while taking medication.  CSW asked patient about mental health providers and patient stated she has access and would contact one if she felt she needed to.  CSW gave patient resource list and asked patient sf she had any more questions.  Patient stated she did not have more questions. CSW updated Unit RN.        Expected Discharge Plan and Services           Expected Discharge Date: 04/26/21                                     Social Determinants of Health (SDOH) Interventions    Readmission Risk Interventions No flowsheet data found.

## 2021-04-26 NOTE — Progress Notes (Signed)
D/C to home with Husband. Pt. V/0 of all instructions and F/U appointments.

## 2021-04-26 NOTE — Plan of Care (Signed)
POC Met. Pt. D/C to Home.

## 2021-04-26 NOTE — Discharge Instructions (Signed)
Call for Temp. Greater than 100.4  No Tampons, Douches or Sex for 6 weeks. No tub bath for 6 weeks. Go to E.R. For any Shortness of Breath, chest Pain or saturating one or more pads in one hour and for any clots larger than a small lemon. Feelings of depression that make you feel unable to take care of yourself and/or baby. Do not lift anything heavier than your baby. No driving for 2 weeks and/or while you are taking Narcotic medication.  Your milk will come in, in the next day or two (right now you have colostrum)You may have a slight fever when your milk comes in,but it should go away on its own.If it does not and rises, please call your doctor.You may also feel achy and your breasts will feel firm.They will also start to leak. If you are breast feeding, continue as you have been and you can pump and express milk for comfort.  If your Pecola Leisure is in SCN, continue to pump and store milk as you have been doing.  If you have too much milk, your breasts can become engorged, which could lead to Mastitis. This is an infection of the Milk Ducts.It can be very painful and you need to notify you Doctor to obtain a prescription for antibiotics.You can also take a warm shower and use warm/cold  compresses for comfort.  For Lactation concerns the Lactation Consultant can be reached at [phone]. Post Partum Blues (feeling happy one minute and sad the next) are normal for the first few weeks but if this gets worse and you cannot preform daily activities and care for yourself, notify your Doctor.  Congratulations! We enjoyed taking care of you!

## 2021-04-29 ENCOUNTER — Encounter: Payer: BC Managed Care – PPO | Admitting: Advanced Practice Midwife

## 2021-05-01 ENCOUNTER — Other Ambulatory Visit: Payer: Self-pay

## 2021-05-01 ENCOUNTER — Encounter: Payer: Self-pay | Admitting: Obstetrics and Gynecology

## 2021-05-01 ENCOUNTER — Ambulatory Visit: Payer: Self-pay

## 2021-05-01 ENCOUNTER — Ambulatory Visit (INDEPENDENT_AMBULATORY_CARE_PROVIDER_SITE_OTHER): Payer: BC Managed Care – PPO | Admitting: Obstetrics and Gynecology

## 2021-05-01 ENCOUNTER — Telehealth: Payer: BC Managed Care – PPO | Admitting: Obstetrics and Gynecology

## 2021-05-01 VITALS — BP 110/64 | Ht 61.0 in | Wt 165.0 lb

## 2021-05-01 DIAGNOSIS — Z4889 Encounter for other specified surgical aftercare: Secondary | ICD-10-CM

## 2021-05-01 NOTE — Telephone Encounter (Signed)
Pt is coming in for Mirena placement with SDJ on 9/2 at 9:10.

## 2021-05-01 NOTE — Lactation Note (Signed)
This note was copied from a baby's chart. Lactation Consultation Note  Patient Name: Carolyn Harrison [id number] Date: 05/01/2021 Reason for consult: Initial assessment;NICU baby;Late-preterm 34-36.6wks Age:37 days  Maternal Data Does the patient have breastfeeding experience prior to this delivery?: Yes How long did the patient breastfeed?: 12 mths  Feeding Mother's Current Feeding Choice: Breast Milk Baby alert but would not latch to breast despite attempts with and without nipple shield, would suck on my gloved finger, but could not coordinate suck at breast despite use of nipple shield and attempts in various positions, gassy and cried when attempting to latch, attempts ended and mom encouraged to try again when baby more calm and reassured that as baby matures would be able to coordinate latch and nursing better.     LATCH Score Latch: Too sleepy or reluctant, no latch achieved, no sucking elicited. (fussy at breast)  Audible Swallowing: None  Type of Nipple: Everted at rest and after stimulation  Comfort (Breast/Nipple): Soft / non-tender  Hold (Positioning): Assistance needed to correctly position infant at breast and maintain latch.  LATCH Score: 5   Lactation Tools Discussed/Used Tools: Nipple Shields Nipple shield size: 24  Interventions Interventions: Assisted with latch;Breast massage;Hand express;Breast compression;Support pillows;Position options (nipple shield.)  Discharge Pump: Personal WIC Program: No  Consult Status Consult Status: PRN    Dyann Kief 05/01/2021, 7:03 PM

## 2021-05-01 NOTE — Progress Notes (Signed)
Postoperative Follow-up Patient presents post op from RLTCS 1weeks ago for  breech .  Subjective: Patient reports some improvement in her preop symptoms. Eating a regular diet without difficulty. Pain is controlled without any medications.  Activity: normal activities of daily living.  Objective: Blood pressure 110/64, height 5\' 1"  (1.549 m), weight 165 lb (74.8 kg), last menstrual period 08/15/2020, currently breastfeeding.  General: NAD Pulmonary: no increased work of breathing Abdomen: soft, non-tender, non-distended, incision D/C/I Extremities: no edema Neurologic: normal gait   Admission on 04/23/2021, Discharged on 04/26/2021  Component Date Value Ref Range Status   WBC 04/23/2021 12.6 (A) 4.0 - 10.5 K/uL Final   RBC 04/23/2021 3.65 (A) 3.87 - 5.11 MIL/uL Final   Hemoglobin 04/23/2021 11.2 (A) 12.0 - 15.0 g/dL Final   HCT 04/25/2021 33.4 (A) 36.0 - 46.0 % Final   MCV 04/23/2021 91.5  80.0 - 100.0 fL Final   MCH 04/23/2021 30.7  26.0 - 34.0 pg Final   MCHC 04/23/2021 33.5  30.0 - 36.0 g/dL Final   RDW 04/25/2021 14.0  11.5 - 15.5 % Final   Platelets 04/23/2021 138 (A) 150 - 400 K/uL Final   nRBC 04/23/2021 0.0  0.0 - 0.2 % Final   Performed at [facility], [address] [place], [place]   SARS Coronavirus 2 by RT PCR 04/23/2021 NEGATIVE  NEGATIVE Final   Comment: (NOTE) SARS-CoV-2 target nucleic acids are NOT DETECTED.  The SARS-CoV-2 RNA is generally detectable in upper respiratory specimens during the acute phase of infection. The lowest concentration of SARS-CoV-2 viral copies this assay can detect is 138 copies/mL. A negative result does not preclude SARS-Cov-2 infection and should not be used as the sole basis for treatment or other patient management decisions. A negative result may occur with  improper specimen collection/handling, submission of specimen other than nasopharyngeal swab, presence of viral mutation(s) within the areas  targeted by this assay, and inadequate number of viral copies(<138 copies/mL). A negative result must be combined with clinical observations, patient history, and epidemiological information. The expected result is Negative.  Fact Sheet for Patients:  04/25/2021  Fact Sheet for Healthcare Providers:  BloggerCourse.com  This test is no                          t yet approved or cleared by the [employer]iousBroker.[place] FDA and  has been authorized for detection and/or diagnosis of SARS-CoV-2 by FDA under an Emergency Use Authorization (EUA). This EUA will remain  in effect (meaning this test can be used) for the duration of the COVID-19 declaration under Section 564(b)(1) of the Act, 21 U.S.C.section 360bbb-3(b)(1), unless the authorization is terminated  or revoked sooner.       Influenza A by PCR 04/23/2021 NEGATIVE  NEGATIVE Final   Influenza B by PCR 04/23/2021 NEGATIVE  NEGATIVE Final   Comment: (NOTE) The Xpert Xpress SARS-CoV-2/FLU/RSV plus assay is intended as an aid in the diagnosis of influenza from Nasopharyngeal swab specimens and should not be used as a sole basis for treatment. Nasal washings and aspirates are unacceptable for Xpert Xpress SARS-CoV-2/FLU/RSV testing.  Fact Sheet for Patients: 04/25/2021  Fact Sheet for Healthcare Providers: BloggerCourse.com  This test is not yet approved or cleared by the Serious[employer]ker.[place] FDA and has been authorized for detection and/or diagnosis of SARS-CoV-2 by FDA under an Emergency Use Authorization (EUA). This EUA will remain in effect (meaning this test  can be used) for the duration of the COVID-19 declaration under Section 564(b)(1) of the Act, 21 U.S.C. section 360bbb-3(b)(1), unless the authorization is terminated or revoked.  Performed at [facility], [address] [place] [place], [place] [address]    ABO/RH(D)  04/23/2021 O POS   Final   Antibody Screen 04/23/2021 NEG   Final   Sample Expiration 04/23/2021    Final                   Value:04/26/2021,2359 Performed at [facility], [address] [place], [place] [address]    RPR Ser Ql 04/23/2021 NON REACTIVE  NON REACTIVE Final   Performed at [facility], [address] [place], [place] [address]   Glucose-Capillary 04/23/2021 92  70 - 99 mg/dL Final   Glucose reference range applies only to samples taken after fasting for at least 8 hours.   Comment 1 04/23/2021 Notify RN   Final   ABO/RH(D) 04/23/2021    Final                   Value:O POS Performed at [facility], [address] [place], [place] [address]    WBC 04/24/2021 9.6  4.0 - 10.5 K/uL Final   RBC 04/24/2021 2.76 (A) 3.87 - 5.11 MIL/uL Final   Hemoglobin 04/24/2021 8.7 (A) 12.0 - 15.0 g/dL Final   HCT 64/40/3474 25.7 (A) 36.0 - 46.0 % Final   MCV 04/24/2021 93.1  80.0 - 100.0 fL Final   MCH 04/24/2021 31.5  26.0 - 34.0 pg Final   MCHC 04/24/2021 33.9  30.0 - 36.0 g/dL Final   RDW 25/95/6387 14.3  11.5 - 15.5 % Final   Platelets 04/24/2021 114 (A) 150 - 400 K/uL Final   Comment: Immature Platelet Fraction may be clinically indicated, consider ordering this additional test [id number]    nRBC 04/24/2021 0.0  0.0 - 0.2 % Final   Performed at [facility], [address] [place], [place] [address]   Glucose-Capillary 04/23/2021 130 (A) 70 - 99 mg/dL Final   Glucose reference range applies only to samples taken after fasting for at least 8 hours.   WBC 04/25/2021 9.4  4.0 - 10.5 K/uL Final   RBC 04/25/2021 2.75 (A) 3.87 - 5.11 MIL/uL Final   Hemoglobin 04/25/2021 8.5 (A) 12.0 - 15.0 g/dL Final   HCT 66/03/3015 25.5 (A) 36.0 - 46.0 % Final   MCV 04/25/2021 92.7  80.0 - 100.0 fL Final   MCH 04/25/2021 30.9  26.0 - 34.0 pg Final   MCHC 04/25/2021 33.3  30.0 - 36.0 g/dL Final   RDW 10/12/3233 14.6  11.5 - 15.5 % Final   Platelets 04/25/2021  118 (A) 150 - 400 K/uL Final   Comment: Immature Platelet Fraction may be clinically indicated, consider ordering this additional test [id number] CRITICAL VALUE NOTED.  VALUE IS CONSISTENT WITH PREVIOUSLY REPORTED AND CALLED VALUE.    nRBC 04/25/2021 0.0  0.0 - 0.2 % Final   Performed at [facility], [address] [place], [place] [address]    Assessment: 35 y.o. s/p RLTCS stable  Plan: Patient has done well after surgery with no apparent complications.  I have discussed the post-operative course to date, and the expected progress moving forward.  The patient understands what complications to be concerned about.  I will see the patient in routine follow up, or sooner if needed.    Activity plan: No heavy lifting.  Plan Mirena  Return in about 5 weeks (around 06/05/2021) for 6 week postpartum SDJ and MIRENA.    [name], MD, [facility], [facility] 05/01/2021, 9:07 AM

## 2021-05-04 NOTE — Telephone Encounter (Signed)
Noted. Will order to arrive by apt date/time. 

## 2021-05-05 ENCOUNTER — Telehealth: Payer: BC Managed Care – PPO

## 2021-05-05 NOTE — Telephone Encounter (Signed)
FMLA/DISABILITY form for [employer] filled out.  Will get signature and fax.

## 2021-06-05 ENCOUNTER — Ambulatory Visit (INDEPENDENT_AMBULATORY_CARE_PROVIDER_SITE_OTHER): Payer: BC Managed Care – PPO | Admitting: Obstetrics and Gynecology

## 2021-06-05 ENCOUNTER — Other Ambulatory Visit: Payer: Self-pay

## 2021-06-05 ENCOUNTER — Encounter: Payer: Self-pay | Admitting: Obstetrics and Gynecology

## 2021-06-05 DIAGNOSIS — Z30430 Encounter for insertion of intrauterine contraceptive device: Secondary | ICD-10-CM | POA: Diagnosis not present

## 2021-06-05 MED ORDER — LEVONORGESTREL 20 MCG/DAY IU IUD: 1.0000 | INTRAUTERINE_SYSTEM | Freq: Once | INTRAUTERINE | 0 refills | Status: AC

## 2021-06-05 NOTE — Progress Notes (Signed)
Postpartum Visit   Chief Complaint  Patient presents with   Postpartum Care   History of Present Illness: Patient is a 35 y.o. [id number] presents for postpartum visit.  Date of delivery: 04/23/2021 Type of delivery: C-Section Episiotomy No.  Laceration: no  Pregnancy or labor problems:  yes-Gestational Diabetes Any problems since the delivery:  no  Newborn Details:  SINGLETON :  1. Baby's name: Greig Castilla. Birth weight: 6.14lb Maternal Details:  Breast Feeding:  yes Post partum depression/anxiety noted:  yes- a little more anxiety. She increased her dose of medication from 20 mg to 40 mg.  She is doing well overall, but notices more anxiety since school started and she can't be there.  Edinburgh Post-Partum Depression Score:  9  Date of last PAP: 10/12/2017  normal   Past Medical History:  Diagnosis Date   Anxiety    Breech presentation 08/15/2010   Gestational diabetes    History of Helicobacter pylori infection    2015   History of Papanicolaou smear of cervix 07/09/2011; 03/13/15   NEG;ASCUS, HPV -;   Rh(D) positive    Spontaneous abortion 2010    Past Surgical History:  Procedure Laterality Date   CESAREAN SECTION  08/25/2010   BREECH PRESENTATION   CESAREAN SECTION  04/23/2021   Procedure: CESAREAN SECTION;  Surgeon: Conard Novak, MD;  Location: [facility] ORS;  Service: Obstetrics;;   GANGLION CYST EXCISION Right 2011   INTRAUTERINE DEVICE (IUD) INSERTION  07/12/2012   TONSILLECTOMY  1994   UPPER GI ENDOSCOPY  12/2013   SL INFLAMMATION OF STOMACH LINING TESTED POS FOR H PYLORI    Prior to Admission medications   Medication Sig Start Date End Date Taking? Authorizing Provider  Cetirizine HCl (ZYRTEC PO) Take by mouth.   Yes [provider]  ferrous sulfate (FERROUSUL) 325 (65 FE) MG tablet Take 1 tablet (325 mg total) by mouth daily with breakfast. 03/23/21  Yes [name], MD  FLUoxetine (PROZAC) 40 MG capsule Take 40 mg by mouth daily.   Yes  [provider]  Prenatal Vit-Fe Fumarate-FA (MULTIVITAMIN-PRENATAL) 27-0.8 MG TABS tablet Take 1 tablet by mouth daily at 12 noon.   Yes [provider]  FLUoxetine (PROZAC) 20 MG capsule Take 1 capsule (20 mg total) by mouth daily. Patient not taking: Reported on 06/05/2021 04/27/21   Tresea Mall, CNM    Allergies  Allergen Reactions   Keflex [Cephalexin]      Social History   Socioeconomic History   Marital status: Married    Spouse name: Not on file   Number of children: 3   Years of education: 16   Highest education level: Not on file  Occupational History   Occupation: TEACHER    Comment: [employer]  Tobacco Use   Smoking status: Never   Smokeless tobacco: Never  Vaping Use   Vaping Use: Never used  Substance and Sexual Activity   Alcohol use: No   Drug use: No   Sexual activity: Yes    Partners: Male    Birth control/protection: I.U.D.  Other Topics Concern   Not on file  Social History Narrative   Not on file   Social Determinants of Health   Financial Resource Strain: Not on file  Food Insecurity: Not on file  Transportation Needs: Not on file  Physical Activity: Not on file  Stress: Not on file  Social Connections: Not on file  Intimate Partner Violence: Not on file    Family History  Problem Relation  Age of Onset   Ulcers Mother        stomach   Diabetes Maternal Grandmother        GESTATIONAL; TYPE 2   Cancer Paternal Grandmother        LEUKEMIA    Review of Systems  Constitutional: Negative.   HENT: Negative.    Eyes: Negative.   Respiratory: Negative.    Cardiovascular: Negative.   Gastrointestinal: Negative.   Genitourinary: Negative.   Musculoskeletal: Negative.   Skin: Negative.   Neurological: Negative.   Psychiatric/Behavioral: Negative.      Physical Exam BP 126/74   Ht 5\' 1"  (1.549 m)   Wt 162 lb (73.5 kg)   LMP 08/15/2020 (Exact Date)   BMI 30.61 kg/m   Physical Exam Constitutional:       General: She is not in acute distress.    Appearance: Normal appearance. She is well-developed.  Genitourinary:     Vulva, bladder and urethral meatus normal.     Right Labia: No rash, tenderness, lesions, skin changes or Bartholin's cyst.    Left Labia: No tenderness, lesions, skin changes, Bartholin's cyst or rash.    No inguinal adenopathy present in the right or left side.    Pelvic Tanner Score: 5/5.     Right Adnexa: not tender, not full and no mass present.    Left Adnexa: not tender, not full and no mass present.    No cervical motion tenderness, friability, lesion or polyp.     Uterus is not enlarged, fixed or tender.     Uterus is anteverted.     No urethral tenderness or mass present.     Pelvic exam was performed with patient in the lithotomy position.  HENT:     Head: Normocephalic and atraumatic.  Eyes:     General: No scleral icterus.    Conjunctiva/sclera: Conjunctivae normal.  Cardiovascular:     Rate and Rhythm: Normal rate and regular rhythm.     Heart sounds: No murmur heard.   No friction rub. No gallop.  Pulmonary:     Effort: Pulmonary effort is normal. No respiratory distress.     Breath sounds: Normal breath sounds. No wheezing or rales.  Abdominal:     General: Bowel sounds are normal. There is no distension.     Palpations: Abdomen is soft. There is no mass.     Tenderness: There is no abdominal tenderness. There is no guarding or rebound.     Hernia: There is no hernia in the left inguinal area or right inguinal area.     Comments: Incision: without erythema, induration, warmth, and tenderness. It is clean, dry, and intact.   Musculoskeletal:        General: Normal range of motion.     Cervical back: Normal range of motion and neck supple.  Lymphadenopathy:     Lower Body: No right inguinal adenopathy. No left inguinal adenopathy.  Neurological:     General: No focal deficit present.     Mental Status: She is alert and oriented to person, place,  and time.     Cranial Nerves: No cranial nerve deficit.  Skin:    General: Skin is warm and dry.     Findings: No erythema.  Psychiatric:        Mood and Affect: Mood normal.        Behavior: Behavior normal.        Judgment: Judgment normal.   IUD Insertion Procedure Note Patient identified,  informed consent performed, consent signed.   Discussed risks of irregular bleeding, cramping, infection, malpositioning, expulsion or uterine perforation of the IUD (1:1000 placements)  which may require further procedure such as laparoscopy.  IUD while effective at preventing pregnancy do not prevent transmission of sexually transmitted diseases and use of barrier methods for this purpose was discussed. Time out was performed.  Urine pregnancy test negative.  Speculum placed in the vagina.  Cervix visualized.  Cleaned with Betadine x 2.  Grasped anteriorly with a single tooth tenaculum.  Uterus sounded to 8 cm. IUD placed per manufacturer's recommendations.  Strings trimmed to 3 cm. Tenaculum was removed, good hemostasis noted.  Patient tolerated procedure well.   Patient was given post-procedure instructions.  She was advised to have backup contraception for one week.  Patient was also asked to check IUD strings periodically and follow up in 4 weeks for IUD check.   Female Chaperone present during breast and/or pelvic exam.  Assessment: 35 y.o. [phone] presenting for 6 week postpartum visit  Plan: Problem List Items Addressed This Visit   None Visit Diagnoses     Postpartum care and examination    -  Primary   Relevant Medications   levonorgestrel (MIRENA) 20 MCG/DAY IUD   Encounter for IUD insertion       Relevant Medications   levonorgestrel (MIRENA) 20 MCG/DAY IUD        1) Contraception: IUD placed today  2)  Pap - ASCCP guidelines and rational discussed.  Patient opts for routine screening interval  3) Patient underwent screening for postpartum depression with some concerns  noted. SHe is managing well for now and I encouraged her to contact me if new problems develop or her symptoms worsen.   Return in about 4 weeks (around 07/03/2021) for IUD String Check.   Thomasene Mohair, MD 06/05/2021 9:51 AM

## 2021-07-01 ENCOUNTER — Encounter: Payer: Self-pay | Admitting: Obstetrics and Gynecology

## 2021-07-01 ENCOUNTER — Ambulatory Visit (INDEPENDENT_AMBULATORY_CARE_PROVIDER_SITE_OTHER): Payer: BC Managed Care – PPO | Admitting: Obstetrics and Gynecology

## 2021-07-01 ENCOUNTER — Other Ambulatory Visit: Payer: Self-pay

## 2021-07-01 VITALS — BP 126/84 | Wt 163.0 lb

## 2021-07-01 DIAGNOSIS — Z30431 Encounter for routine checking of intrauterine contraceptive device: Secondary | ICD-10-CM | POA: Diagnosis not present

## 2021-07-01 NOTE — Progress Notes (Signed)
   IUD String Check  Subjctive: [name] presents for IUD string check.  She had a Mirena placed 4 weeks ago.  Since placement of her IUD she had some vaginal bleeding.  She denies cramping or discomfort.  She has not had intercourse since placement.  She has not checked the strings.  She denies any fever, chills, nausea, vomiting, or other complaints.    Objective: BP 126/84   Wt 163 lb (73.9 kg)   LMP 08/15/2020 (Exact Date)   BMI 30.80 kg/m  Physical Exam Constitutional:      General: She is not in acute distress.    Appearance: Normal appearance.  Genitourinary:     Bladder and urethral meatus normal.     No lesions in the vagina.     Right Labia: No rash, tenderness, lesions or skin changes.    Left Labia: No tenderness, lesions, skin changes or rash.    No inguinal adenopathy present in the right or left side.    Pelvic Tanner Score: 5/5.    No vaginal discharge, erythema, bleeding or ulceration.     No vaginal prolapse present.     Right Adnexa: not tender, not full and no mass present.    Left Adnexa: not tender, not full and no mass present.    No cervical motion tenderness, discharge, friability, lesion or polyp.     Uterus is not enlarged, fixed or tender.     Uterus is anteverted.  HENT:     Head: Normocephalic and atraumatic.  Eyes:     General: No scleral icterus.    Conjunctiva/sclera: Conjunctivae normal.  Lymphadenopathy:     Lower Body: No right inguinal adenopathy. No left inguinal adenopathy.  Neurological:     General: No focal deficit present.     Mental Status: She is alert and oriented to person, place, and time.     Cranial Nerves: No cranial nerve deficit.  Psychiatric:        Mood and Affect: Mood normal.        Behavior: Behavior normal.        Judgment: Judgment normal.   Bedside transabdominal shows IUD in correct location  Female chaperone was present for the entirety of the pelvic exam  Assessment: 35 y.o. year old female  status post prior Mirena IUD placement 4 week ago, doing well.  Plan: 1.  The patient was given instructions to check her IUD strings monthly and call with any problems or concerns.  She should call for fevers, chills, abnormal vaginal discharge, pelvic pain, or other complaints. 2.  She will return for a annual exam in 1 year.  All questions answered.  23 minutes spent in face to face discussion with > 50% spent in counseling, management, and coordination of care for her newly-placed IUD.  Risks and benefits of IUD discussed including the risks of irregular bleeding, cramping, infection, malpositioning, expulsion, which may require further procedures such as laparoscopy.  IUDs while effective at preventing pregnancy do not prevent transmission of sexually transmitted diseases and use of barrier methods for this purpose was discussed.  Low overall incidence of failure with 99.7% efficacy rate in typical use.    Thomasene Mohair, MD 07/01/2021 11:13 AM

## 2021-07-29 NOTE — Telephone Encounter (Signed)
Mirena rcvd/charged 06/05/2021

## 2021-10-09 ENCOUNTER — Encounter: Payer: Self-pay | Admitting: Nurse Practitioner

## 2021-10-09 ENCOUNTER — Ambulatory Visit: Payer: BC Managed Care – PPO | Admitting: Nurse Practitioner

## 2021-10-09 ENCOUNTER — Other Ambulatory Visit: Payer: Self-pay

## 2021-10-09 VITALS — BP 95/64 | HR 75 | Temp 98.7°F | Wt 167.0 lb

## 2021-10-09 DIAGNOSIS — J029 Acute pharyngitis, unspecified: Secondary | ICD-10-CM | POA: Diagnosis not present

## 2021-10-09 DIAGNOSIS — J020 Streptococcal pharyngitis: Secondary | ICD-10-CM

## 2021-10-09 LAB — RAPID STREP SCREEN (MED CTR MEBANE ONLY): Strep Gp A Ag, IA W/Reflex: POSITIVE — AB

## 2021-10-09 LAB — VERITOR FLU A/B WAIVED
Influenza A: NEGATIVE
Influenza B: NEGATIVE

## 2021-10-09 MED ORDER — AMOXICILLIN 500 MG PO CAPS: 500.0000 mg | ORAL_CAPSULE | Freq: Two times a day (BID) | ORAL | 0 refills | Status: AC

## 2021-10-09 NOTE — Progress Notes (Signed)
Acute Office Visit  Subjective:    Patient ID: Carolyn Harrison, female    DOB: [birth year], 36 y.o.   MRN: [id number]  Chief Complaint  Patient presents with   Sore Throat    Patient states the past week she has had a lot of pain when swallowing and her neck has been sore. Patient first noticed it Tuesday. Patient states her 44-month old is starting to show similar symptoms. Patient states she has had her tonsils removed and she hasn't had strep since she had them removed years ago. Patient denies having any fever or any other symptoms.     HPI Patient is in today for sore  throat for 4 days.   UPPER RESPIRATORY TRACT INFECTION  Worst symptom: sore throat Fever: no Cough: no Shortness of breath: no Wheezing: no Chest pain: no Chest tightness: no Chest congestion: no Nasal congestion: no Runny nose: yes Post nasal drip: yes Sneezing: no Sore throat: yes Swollen glands: yes Sinus pressure: no Headache: no Face pain: no Toothache: no Ear pain: no  Ear pressure: no  Eyes red/itching:no Eye drainage/crusting: no  Vomiting: no Rash: no Fatigue: yes Sick contacts: yes - son Strep contacts: no  Context: worse Recurrent sinusitis: no Relief with OTC cold/cough medications: no  Treatments attempted: tylenol    Past Medical History:  Diagnosis Date   Anxiety    Breech presentation 08/15/2010   Gestational diabetes    History of Helicobacter pylori infection    2015   History of Papanicolaou smear of cervix 07/09/2011; 03/13/15   NEG;ASCUS, HPV -;   Rh(D) positive    Spontaneous abortion 2010    Past Surgical History:  Procedure Laterality Date   CESAREAN SECTION  08/25/2010   BREECH PRESENTATION   CESAREAN SECTION  04/23/2021   Procedure: CESAREAN SECTION;  Surgeon: Conard Novak, MD;  Location: [facility];  Service: Obstetrics;;   GANGLION CYST EXCISION Right 2011   INTRAUTERINE DEVICE (IUD) INSERTION  07/12/2012   TONSILLECTOMY  1994   UPPER GI ENDOSCOPY   12/2013   SL INFLAMMATION OF STOMACH LINING TESTED POS FOR H PYLORI    Family History  Problem Relation Age of Onset   Ulcers Mother        stomach   Diabetes Maternal Grandmother        GESTATIONAL; TYPE 2   Cancer Paternal Grandmother        LEUKEMIA    Social History   Socioeconomic History   Marital status: Married    Spouse name: Not on file   Number of children: 3   Years of education: 16   Highest education level: Not on file  Occupational History   Occupation: TEACHER    Comment: [employer]  Tobacco Use   Smoking status: Never   Smokeless tobacco: Never  Vaping Use   Vaping Use: Never used  Substance and Sexual Activity   Alcohol use: No   Drug use: No   Sexual activity: Yes    Partners: Male    Birth control/protection: I.U.D.  Other Topics Concern   Not on file  Social History Narrative   Not on file   Social Determinants of Health   Financial Resource Strain: Not on file  Food Insecurity: Not on file  Transportation Needs: Not on file  Physical Activity: Not on file  Stress: Not on file  Social Connections: Not on file  Intimate Partner Violence: Not on file    Outpatient Medications Prior to Visit  Medication Sig Dispense Refill   Cetirizine HCl (ZYRTEC PO) Take by mouth. (Patient not taking: Reported on 10/09/2021)     ferrous sulfate (FERROUSUL) 325 (65 FE) MG tablet Take 1 tablet (325 mg total) by mouth daily with breakfast. (Patient not taking: Reported on 10/09/2021) 30 tablet 1   FLUoxetine (PROZAC) 20 MG capsule Take 1 capsule (20 mg total) by mouth daily. (Patient not taking: Reported on 06/05/2021) 30 capsule 11   FLUoxetine (PROZAC) 40 MG capsule Take 40 mg by mouth daily.     levonorgestrel (MIRENA) 20 MCG/DAY IUD 1 each by Intrauterine route once for 1 dose. 1 each 0   Prenatal Vit-Fe Fumarate-FA (MULTIVITAMIN-PRENATAL) 27-0.8 MG TABS tablet Take 1 tablet by mouth daily at 12 noon. (Patient not taking: Reported on 10/09/2021)     No  facility-administered medications prior to visit.    Allergies  Allergen Reactions   Keflex [Cephalexin]     Review of Systems  Constitutional:  Positive for fatigue. Negative for fever.  HENT:  Positive for rhinorrhea and sore throat. Negative for congestion, ear pain, sinus pressure, sinus pain and sneezing.   Eyes: Negative.   Respiratory: Negative.    Cardiovascular: Negative.   Gastrointestinal: Negative.   Musculoskeletal: Negative.   Skin: Negative.   Neurological: Negative.       Objective:    Physical Exam Vitals and nursing note reviewed.  Constitutional:      General: She is not in acute distress.    Appearance: Normal appearance.  HENT:     Head: Normocephalic.     Right Ear: Tympanic membrane, ear canal and external ear normal.     Left Ear: Tympanic membrane, ear canal and external ear normal.     Mouth/Throat:     Pharynx: Oropharyngeal exudate and posterior oropharyngeal erythema present.  Eyes:     Conjunctiva/sclera: Conjunctivae normal.  Cardiovascular:     Rate and Rhythm: Normal rate and regular rhythm.     Pulses: Normal pulses.     Heart sounds: Normal heart sounds.  Pulmonary:     Effort: Pulmonary effort is normal.     Breath sounds: Normal breath sounds.  Musculoskeletal:     Cervical back: Normal range of motion and neck supple. Tenderness present.  Lymphadenopathy:     Cervical: No cervical adenopathy.  Skin:    General: Skin is warm.  Neurological:     General: No focal deficit present.     Mental Status: She is alert and oriented to person, place, and time.  Psychiatric:        Mood and Affect: Mood normal.        Behavior: Behavior normal.        Thought Content: Thought content normal.        Judgment: Judgment normal.    BP 95/64    Pulse 75    Temp 98.7 F (37.1 C) (Oral)    Wt 167 lb (75.8 kg)    SpO2 97%    BMI 31.55 kg/m  Wt Readings from Last 3 Encounters:  10/09/21 167 lb (75.8 kg)  07/01/21 163 lb (73.9 kg)   06/05/21 162 lb (73.5 kg)    Health Maintenance Due  Topic Date Due   Hepatitis C Screening  Never done   COVID-19 Vaccine (3 - Booster for Moderna series) 03/06/2020   INFLUENZA VACCINE  05/04/2021    There are no preventive care reminders to display for this patient.   Lab Results  Component Value  Date   TSH 1.210 04/04/2020   Lab Results  Component Value Date   WBC 9.4 04/25/2021   HGB 8.5 (L) 04/25/2021   HCT 25.5 (L) 04/25/2021   MCV 92.7 04/25/2021   PLT 118 (L) 04/25/2021   Lab Results  Component Value Date   NA 138 04/04/2020   K 4.0 04/04/2020   CO2 26 04/04/2020   GLUCOSE 90 04/04/2020   BUN 11 04/04/2020   CREATININE 0.92 04/04/2020   BILITOT 0.3 04/04/2020   ALKPHOS 88 04/04/2020   AST 17 04/04/2020   ALT 15 04/04/2020   PROT 6.6 04/04/2020   ALBUMIN 4.2 04/04/2020   CALCIUM 8.9 04/04/2020   Lab Results  Component Value Date   CHOL 197 04/04/2020   Lab Results  Component Value Date   HDL 61 04/04/2020   Lab Results  Component Value Date   LDLCALC 121 (H) 04/04/2020   Lab Results  Component Value Date   TRIG 84 04/04/2020   Lab Results  Component Value Date   CHOLHDL 2.5 03/23/2018   Lab Results  Component Value Date   HGBA1C 5.3 10/24/2020       Assessment & Plan:   Problem List Items Addressed This Visit   None Visit Diagnoses     Strep pharyngitis    -  Primary   Treat with amoxicillin BIDx10 days. States she has tolerated this in the past. Can take tylenol prn pain. Gargle with warm salt water. F/U if not improving   Sore throat       Flu negative, positive for rapid strep. See plan for strep pharyngitis   Relevant Orders   Rapid Strep screen([facility])   Novel Coronavirus, NAA ([facility])   Veritor Flu A/B Waived        Meds ordered this encounter  Medications   amoxicillin (AMOXIL) 500 MG capsule    Sig: Take 1 capsule (500 mg total) by mouth 2 (two) times daily for 10 days.    Dispense:  20 capsule     Refill:  0     Gerre ScullLauren A Jennessy Sandridge, NP

## 2021-10-10 LAB — SARS-COV-2, NAA 2 DAY TAT

## 2021-10-10 LAB — NOVEL CORONAVIRUS, NAA: SARS-CoV-2, NAA: NOT DETECTED

## 2021-11-06 ENCOUNTER — Ambulatory Visit (INDEPENDENT_AMBULATORY_CARE_PROVIDER_SITE_OTHER): Payer: BC Managed Care – PPO | Admitting: Nurse Practitioner

## 2021-11-06 ENCOUNTER — Encounter: Payer: Self-pay | Admitting: Nurse Practitioner

## 2021-11-06 ENCOUNTER — Other Ambulatory Visit: Payer: Self-pay

## 2021-11-06 VITALS — BP 103/67 | HR 64 | Temp 98.1°F | Ht 62.0 in | Wt 164.6 lb

## 2021-11-06 DIAGNOSIS — F341 Dysthymic disorder: Secondary | ICD-10-CM

## 2021-11-06 DIAGNOSIS — Z1159 Encounter for screening for other viral diseases: Secondary | ICD-10-CM | POA: Diagnosis not present

## 2021-11-06 DIAGNOSIS — Z0000 Encounter for general adult medical examination without abnormal findings: Secondary | ICD-10-CM | POA: Diagnosis not present

## 2021-11-06 LAB — URINALYSIS, ROUTINE W REFLEX MICROSCOPIC
Bilirubin, UA: NEGATIVE
Glucose, UA: NEGATIVE
Ketones, UA: NEGATIVE
Leukocytes,UA: NEGATIVE
Nitrite, UA: NEGATIVE
Protein,UA: NEGATIVE
RBC, UA: NEGATIVE
Specific Gravity, UA: 1.025 (ref 1.005–1.030)
Urobilinogen, Ur: 0.2 mg/dL (ref 0.2–1.0)
pH, UA: 6.5 (ref 5.0–7.5)

## 2021-11-06 NOTE — Assessment & Plan Note (Signed)
Chronic.  Controlled.  Continue with current medication regimen on Prozac 40mg  daily.  Labs ordered today.  Return to clinic in 6 months for reevaluation.  Call sooner if concerns arise.

## 2021-11-06 NOTE — Progress Notes (Signed)
BP 103/67    Pulse 64    Temp 98.1 F (36.7 C) (Oral)    Ht 5\' 2"  (1.575 m)    Wt 164 lb 9.6 oz (74.7 kg)    LMP  (LMP Unknown)    SpO2 96%    BMI 30.11 kg/m    Subjective:    Patient ID: Carolyn Harrison, female    DOB: [birth year], 36 y.o.   MRN: [id number]  HPI: Carolyn Carolyn Harrison is a 36 y.o. female presenting on 11/06/2021 for comprehensive medical examination. Current medical complaints include:none  She currently lives with: Menopausal Symptoms: no   Denies HA, CP, SOB, dizziness, palpitations, visual changes, and lower extremity swelling.  Depression Screen done today and results listed below:  Depression screen [facility] 2/9 11/06/2021 10/09/2021 04/04/2020 03/30/2019 12/29/2018  Decreased Interest 0 1 0 0 0  Down, Depressed, Hopeless 1 2 1  0 1  PHQ - 2 Score 1 3 1  0 1  Altered sleeping 0 2 1 2 1   Tired, decreased energy 1 2 1  0 0  Change in appetite 0 1 0 0 0  Feeling bad or failure about yourself  1 2 1  0 0  Trouble concentrating 0 0 0 0 0  Moving slowly or fidgety/restless 0 0 0 1 0  Suicidal thoughts 0 0 0 0 0  PHQ-9 Score 3 10 4 3 2   Difficult doing work/chores Somewhat difficult - - - -  Some recent data might be hidden    The patient does not have a history of falls. I did complete a risk assessment for falls. A plan of care for falls was documented.   Past Medical History:  Past Medical History:  Diagnosis Date   Anxiety    Breech presentation 08/15/2010   Gestational diabetes    History of Helicobacter pylori infection    2015   History of Papanicolaou smear of cervix 07/09/2011; 03/13/15   NEG;ASCUS, HPV -;   Rh(D) positive    Spontaneous abortion 2010    Surgical History:  Past Surgical History:  Procedure Laterality Date   CESAREAN SECTION  08/25/2010   BREECH PRESENTATION   CESAREAN SECTION  04/23/2021   Procedure: CESAREAN SECTION;  Surgeon: Will Bonnet, MD;  Location: [facility] ORS;  Service: Obstetrics;;   GANGLION CYST EXCISION Right 2011   INTRAUTERINE  DEVICE (IUD) INSERTION  07/12/2012   TONSILLECTOMY  1994   UPPER GI ENDOSCOPY  12/2013   SL INFLAMMATION OF STOMACH LINING TESTED POS FOR H PYLORI    Medications:  Current Outpatient Medications on File Prior to Visit  Medication Sig   FLUoxetine (PROZAC) 40 MG capsule Take 40 mg by mouth daily.   Prenatal Vit-Fe Fumarate-FA (MULTIVITAMIN-PRENATAL) 27-0.8 MG TABS tablet Take 1 tablet by mouth daily at 12 noon.   levonorgestrel (MIRENA) 20 MCG/DAY IUD 1 each by Intrauterine route once for 1 dose.   No current facility-administered medications on file prior to visit.    Allergies:  Allergies  Allergen Reactions   Keflex [Cephalexin]     Social History:  Social History   Socioeconomic History   Marital status: Married    Spouse name: Not on file   Number of children: 3   Years of education: 16   Highest education level: Not on file  Occupational History   Occupation: TEACHER    Comment: [employer]  Tobacco Use   Smoking status: Never   Smokeless tobacco: Never  Vaping Use   Vaping Use: Never  used  Substance and Sexual Activity   Alcohol use: No   Drug use: No   Sexual activity: Yes    Partners: Male    Birth control/protection: I.U.D.  Other Topics Concern   Not on file  Social History Narrative   Not on file   Social Determinants of Health   Financial Resource Strain: Not on file  Food Insecurity: Not on file  Transportation Needs: Not on file  Physical Activity: Not on file  Stress: Not on file  Social Connections: Not on file  Intimate Partner Violence: Not on file   Social History   Tobacco Use  Smoking Status Never  Smokeless Tobacco Never   Social History   Substance and Sexual Activity  Alcohol Use No    Family History:  Family History  Problem Relation Age of Onset   Ulcers Mother        stomach   Diabetes Maternal Grandmother        GESTATIONAL; TYPE 2   Cancer Paternal Grandmother        LEUKEMIA    Past medical history,  surgical history, medications, allergies, family history and social history reviewed with patient today and changes made to appropriate areas of the chart.   Review of Systems  Eyes:  Negative for blurred vision and double vision.  Respiratory:  Negative for shortness of breath.   Cardiovascular:  Negative for chest pain, palpitations and leg swelling.  Neurological:  Negative for dizziness and headaches.  All other ROS negative except what is listed above and in the HPI.      Objective:    BP 103/67    Pulse 64    Temp 98.1 F (36.7 C) (Oral)    Ht 5\' 2"  (1.575 m)    Wt 164 lb 9.6 oz (74.7 kg)    LMP  (LMP Unknown)    SpO2 96%    BMI 30.11 kg/m   Wt Readings from Last 3 Encounters:  11/06/21 164 lb 9.6 oz (74.7 kg)  10/09/21 167 lb (75.8 kg)  07/01/21 163 lb (73.9 kg)    Physical Exam Vitals and nursing note reviewed.  Constitutional:      General: She is awake. She is not in acute distress.    Appearance: She is well-developed. She is not ill-appearing.  HENT:     Head: Normocephalic and atraumatic.     Right Ear: Hearing, tympanic membrane, ear canal and external ear normal. No drainage.     Left Ear: Hearing, tympanic membrane, ear canal and external ear normal. No drainage.     Nose: Nose normal.     Right Sinus: No maxillary sinus tenderness or frontal sinus tenderness.     Left Sinus: No maxillary sinus tenderness or frontal sinus tenderness.     Mouth/Throat:     Mouth: Mucous membranes are moist.     Pharynx: Oropharynx is clear. Uvula midline. No pharyngeal swelling, oropharyngeal exudate or posterior oropharyngeal erythema.  Eyes:     General: Lids are normal.        Right eye: No discharge.        Left eye: No discharge.     Extraocular Movements: Extraocular movements intact.     Conjunctiva/sclera: Conjunctivae normal.     Pupils: Pupils are equal, round, and reactive to light.     Visual Fields: Right eye visual fields normal and left eye visual fields normal.   Neck:     Thyroid: No thyromegaly.  Vascular: No carotid bruit.     Trachea: Trachea normal.  Cardiovascular:     Rate and Rhythm: Normal rate and regular rhythm.     Heart sounds: Normal heart sounds. No murmur heard.   No gallop.  Pulmonary:     Effort: Pulmonary effort is normal. No accessory muscle usage or respiratory distress.     Breath sounds: Normal breath sounds.  Chest:  Breasts:    Right: Normal.     Left: Normal.  Abdominal:     General: Bowel sounds are normal.     Palpations: Abdomen is soft. There is no hepatomegaly or splenomegaly.     Tenderness: There is no abdominal tenderness.  Musculoskeletal:        General: Normal range of motion.     Cervical back: Normal range of motion and neck supple.     Right lower leg: No edema.     Left lower leg: No edema.  Lymphadenopathy:     Head:     Right side of head: No submental, submandibular, tonsillar, preauricular or posterior auricular adenopathy.     Left side of head: No submental, submandibular, tonsillar, preauricular or posterior auricular adenopathy.     Cervical: No cervical adenopathy.     Upper Body:     Right upper body: No supraclavicular, axillary or pectoral adenopathy.     Left upper body: No supraclavicular, axillary or pectoral adenopathy.  Skin:    General: Skin is warm and dry.     Capillary Refill: Capillary refill takes less than 2 seconds.     Findings: No rash.  Neurological:     Mental Status: She is alert and oriented to person, place, and time.     Gait: Gait is intact.     Deep Tendon Reflexes: Reflexes are normal and symmetric.     Reflex Scores:      Brachioradialis reflexes are 2+ on the right side and 2+ on the left side.      Patellar reflexes are 2+ on the right side and 2+ on the left side. Psychiatric:        Attention and Perception: Attention normal.        Mood and Affect: Mood normal.        Speech: Speech normal.        Behavior: Behavior normal. Behavior is  cooperative.        Thought Content: Thought content normal.        Judgment: Judgment normal.    Results for orders placed or performed in visit on 10/09/21  Rapid Strep screen([facility])   Specimen: Other   Other  Result Value Ref Range   Strep Gp A Ag, IA W/Reflex Positive (A) Negative  Novel Coronavirus, NAA ([facility])   Specimen: Nasopharyngeal(NP) swabs in vial transport medium  Result Value Ref Range   SARS-CoV-2, NAA Not Detected Not Detected  SARS-COV-2, NAA 2 DAY TAT  Result Value Ref Range   SARS-CoV-2, NAA 2 DAY TAT Performed   Veritor Flu A/B Waived  Result Value Ref Range   Influenza A Negative Negative   Influenza B Negative Negative      Assessment & Plan:   Problem List Items Addressed This Visit       Other   Depression    Chronic.  Controlled.  Continue with current medication regimen on Prozac 40mg  daily.  Labs ordered today.  Return to clinic in 6 months for reevaluation.  Call sooner if concerns arise.  Other Visit Diagnoses     Annual physical exam    -  Primary   Health maintenance reviewed during visit today. Labs ordered. PAP up to date.   Relevant Orders   CBC with Differential/Platelet   Comprehensive metabolic panel   Lipid panel   TSH   Urinalysis, Routine w reflex microscopic   Encounter for hepatitis C screening test for low risk patient       Relevant Orders   Hepatitis C Antibody        Follow up plan: Return in about 6 months (around 05/06/2022) for Depression/Anxiety FU.   LABORATORY TESTING:  - Pap smear: up to date  IMMUNIZATIONS:   - Tdap: Tetanus vaccination status reviewed: last tetanus booster within 10 years. - Influenza: Refused - Pneumovax: Not applicable - Prevnar: Not applicable - COVID: Up to date - HPV: Not applicable - Shingrix vaccine: Not applicable  SCREENING: -Mammogram: Not applicable  - Colonoscopy: Not applicable  - Bone Density: Not applicable  -Hearing Test: Not applicable   -Spirometry: Not applicable   PATIENT COUNSELING:   Advised to take 1 mg of folate supplement per day if capable of pregnancy.   Sexuality: Discussed sexually transmitted diseases, partner selection, use of condoms, avoidance of unintended pregnancy  and contraceptive alternatives.   Advised to avoid cigarette smoking.  I discussed with the patient that most people either abstain from alcohol or drink within safe limits (<=14/week and <=4 drinks/occasion for males, <=7/weeks and <= 3 drinks/occasion for females) and that the risk for alcohol disorders and other health effects rises proportionally with the number of drinks per week and how often a drinker exceeds daily limits.  Discussed cessation/primary prevention of drug use and availability of treatment for abuse.   Diet: Encouraged to adjust caloric intake to maintain  or achieve ideal body weight, to reduce intake of dietary saturated fat and total fat, to limit sodium intake by avoiding high sodium foods and not adding table salt, and to maintain adequate dietary potassium and calcium preferably from fresh fruits, vegetables, and low-fat dairy products.    stressed the importance of regular exercise  Injury prevention: Discussed safety belts, safety helmets, smoke detector, smoking near bedding or upholstery.   Dental health: Discussed importance of regular tooth brushing, flossing, and dental visits.    NEXT PREVENTATIVE PHYSICAL DUE IN 1 YEAR. Return in about 6 months (around 05/06/2022) for Depression/Anxiety FU.

## 2021-11-07 LAB — LIPID PANEL
Chol/HDL Ratio: 2.8 ratio (ref 0.0–4.4)
Cholesterol, Total: 174 mg/dL (ref 100–199)
HDL: 63 mg/dL (ref >39)
LDL Chol Calc (NIH): 95 mg/dL (ref 0–99)
Triglycerides: 90 mg/dL (ref 0–149)
VLDL Cholesterol Cal: 16 mg/dL (ref 5–40)

## 2021-11-07 LAB — COMPREHENSIVE METABOLIC PANEL
ALT: 15 IU/L (ref 0–32)
AST: 20 IU/L (ref 0–40)
Albumin/Globulin Ratio: 1.8 (ref 1.2–2.2)
Albumin: 4.6 g/dL (ref 3.8–4.8)
Alkaline Phosphatase: 124 IU/L — ABNORMAL HIGH (ref 44–121)
BUN/Creatinine Ratio: 20 (ref 9–23)
BUN: 14 mg/dL (ref 6–20)
Bilirubin Total: 0.2 mg/dL (ref 0.0–1.2)
CO2: 26 mmol/L (ref 20–29)
Calcium: 9.3 mg/dL (ref 8.7–10.2)
Chloride: 101 mmol/L (ref 96–106)
Creatinine, Ser: 0.69 mg/dL (ref 0.57–1.00)
Globulin, Total: 2.5 g/dL (ref 1.5–4.5)
Glucose: 94 mg/dL (ref 70–99)
Potassium: 4.2 mmol/L (ref 3.5–5.2)
Sodium: 139 mmol/L (ref 134–144)
Total Protein: 7.1 g/dL (ref 6.0–8.5)
eGFR: 114 mL/min/{1.73_m2} (ref >59)

## 2021-11-07 LAB — CBC WITH DIFFERENTIAL/PLATELET
Basophils Absolute: 0 10*3/uL (ref 0.0–0.2)
Basos: 0 %
EOS (ABSOLUTE): 0.2 10*3/uL (ref 0.0–0.4)
Eos: 3 %
Hematocrit: 39.4 % (ref 34.0–46.6)
Hemoglobin: 13 g/dL (ref 11.1–15.9)
Immature Grans (Abs): 0 10*3/uL (ref 0.0–0.1)
Immature Granulocytes: 0 %
Lymphocytes Absolute: 2.2 10*3/uL (ref 0.7–3.1)
Lymphs: 36 %
MCH: 28.6 pg (ref 26.6–33.0)
MCHC: 33 g/dL (ref 31.5–35.7)
MCV: 87 fL (ref 79–97)
Monocytes Absolute: 0.5 10*3/uL (ref 0.1–0.9)
Monocytes: 7 %
Neutrophils Absolute: 3.2 10*3/uL (ref 1.4–7.0)
Neutrophils: 54 %
Platelets: 236 10*3/uL (ref 150–450)
RBC: 4.54 x10E6/uL (ref 3.77–5.28)
RDW: 12.1 % (ref 11.7–15.4)
WBC: 6.1 10*3/uL (ref 3.4–10.8)

## 2021-11-07 LAB — TSH: TSH: 1.43 u[IU]/mL (ref 0.450–4.500)

## 2021-11-07 LAB — HEPATITIS C ANTIBODY: Hep C Virus Ab: <0.1 s/co ratio (ref 0.0–0.9)

## 2021-11-09 NOTE — Progress Notes (Signed)
Hi River. It was nice to see you last week.  Your lab work looks good.  No concerns at this time. Continue with your current medication regimen.  Follow up as discussed.  Please let me know if you have any questions.  

## 2022-01-18 ENCOUNTER — Encounter: Payer: Self-pay | Admitting: Nurse Practitioner

## 2022-01-19 ENCOUNTER — Other Ambulatory Visit: Payer: Self-pay

## 2022-01-19 MED ORDER — FLUOXETINE HCL 40 MG PO CAPS: 40.0000 mg | ORAL_CAPSULE | Freq: Every day | ORAL | 1 refills | Status: DC

## 2022-01-19 NOTE — Telephone Encounter (Signed)
Patient was just seen on 2/3 for a physical. Patient is requesting a refill of her Prozac 40 mg. Patient has an up coming appt on 8/3 ?

## 2022-05-06 ENCOUNTER — Ambulatory Visit: Payer: BC Managed Care – PPO | Admitting: Nurse Practitioner

## 2022-05-06 ENCOUNTER — Encounter: Payer: Self-pay | Admitting: Nurse Practitioner

## 2022-05-06 VITALS — BP 107/70 | HR 62 | Temp 98.6°F | Wt 172.9 lb

## 2022-05-06 DIAGNOSIS — F341 Dysthymic disorder: Secondary | ICD-10-CM

## 2022-05-06 MED ORDER — FLUOXETINE HCL 40 MG PO CAPS: 40.0000 mg | ORAL_CAPSULE | Freq: Every day | ORAL | 1 refills | Status: DC

## 2022-05-06 NOTE — Assessment & Plan Note (Signed)
Chronic.  Controlled.  Continue with current medication regimen of Prozac 40mg .  Refill sent today. Return to clinic in 6 months for reevaluation.  Call sooner if concerns arise.

## 2022-05-06 NOTE — Progress Notes (Signed)
BP 107/70   Pulse 62   Temp 98.6 F (37 C) (Oral)   Wt 172 lb 14.4 oz (78.4 kg)   LMP  (LMP Unknown)   SpO2 97%   Breastfeeding No   BMI 31.62 kg/m    Subjective:    Patient ID: Carolyn Harrison, female    DOB: 10/01/1986, 36 y.o.   MRN: [id number]  HPI: Carolyn Harrison is a 36 y.o. female  Chief Complaint  Patient presents with   Anxiety   Depression   DEPRESSION/ANXIETY Patient states things are going well.  Denies concerns at visit today.  She is done breastfeeding and that stress has improved some.    [place] Office Visit from 05/06/2022 in [place]  PHQ-9 Total Score 4         05/06/2022    2:00 PM 11/06/2021    3:51 PM 10/09/2021    9:39 AM 04/04/2020    4:28 PM  GAD 7 : Generalized Anxiety Score  Nervous, Anxious, on Edge '1 1 3 ' 0  Control/stop worrying '1 1 3 2  ' Worry too much - different things '1 1 3 1  ' Trouble relaxing 0 0 1 0  Restless 0 0 2 0  Easily annoyed or irritable 0 0 3 0  Afraid - awful might happen 0 0 1 0  Total GAD 7 Score '3 3 16 3  ' Anxiety Difficulty Not difficult at all Somewhat difficult  Somewhat difficult       Relevant past medical, surgical, family and social history reviewed and updated as indicated. Interim medical history since our last visit reviewed. Allergies and medications reviewed and updated.  Review of Systems  Psychiatric/Behavioral:  Positive for dysphoric mood. Negative for suicidal ideas. The patient is nervous/anxious.     Per HPI unless specifically indicated above     Objective:    BP 107/70   Pulse 62   Temp 98.6 F (37 C) (Oral)   Wt 172 lb 14.4 oz (78.4 kg)   LMP  (LMP Unknown)   SpO2 97%   Breastfeeding No   BMI 31.62 kg/m   Wt Readings from Last 3 Encounters:  05/06/22 172 lb 14.4 oz (78.4 kg)  11/06/21 164 lb 9.6 oz (74.7 kg)  10/09/21 167 lb (75.8 kg)    Physical Exam Vitals and nursing note reviewed.  Constitutional:      General: She is not in acute distress.     Appearance: Normal appearance. She is normal weight. She is not ill-appearing, toxic-appearing or diaphoretic.  HENT:     Head: Normocephalic.     Right Ear: External ear normal.     Left Ear: External ear normal.     Nose: Nose normal.     Mouth/Throat:     Mouth: Mucous membranes are moist.     Pharynx: Oropharynx is clear.  Eyes:     General:        Right eye: No discharge.        Left eye: No discharge.     Extraocular Movements: Extraocular movements intact.     Conjunctiva/sclera: Conjunctivae normal.     Pupils: Pupils are equal, round, and reactive to light.  Cardiovascular:     Rate and Rhythm: Normal rate and regular rhythm.     Heart sounds: No murmur heard. Pulmonary:     Effort: Pulmonary effort is normal. No respiratory distress.     Breath sounds: Normal breath sounds. No wheezing or rales.  Musculoskeletal:     Cervical back: Normal range of motion and neck supple.  Skin:    General: Skin is warm and dry.     Capillary Refill: Capillary refill takes less than 2 seconds.  Neurological:     General: No focal deficit present.     Mental Status: She is alert and oriented to person, place, and time. Mental status is at baseline.  Psychiatric:        Mood and Affect: Mood normal.        Behavior: Behavior normal.        Thought Content: Thought content normal.        Judgment: Judgment normal.     Results for orders placed or performed in visit on 11/06/21  Hepatitis C Antibody  Result Value Ref Range   Hep C Virus Ab <0.1 0.0 - 0.9 s/co ratio  CBC with Differential/Platelet  Result Value Ref Range   WBC 6.1 3.4 - 10.8 x10E3/uL   RBC 4.54 3.77 - 5.28 x10E6/uL   Hemoglobin 13.0 11.1 - 15.9 g/dL   Hematocrit 39.4 34.0 - 46.6 %   MCV 87 79 - 97 fL   MCH 28.6 26.6 - 33.0 pg   MCHC 33.0 31.5 - 35.7 g/dL   RDW 12.1 11.7 - 15.4 %   Platelets 236 150 - 450 x10E3/uL   Neutrophils 54 Not Estab. %   Lymphs 36 Not Estab. %   Monocytes 7 Not Estab. %   Eos 3 Not  Estab. %   Basos 0 Not Estab. %   Neutrophils Absolute 3.2 1.4 - 7.0 x10E3/uL   Lymphocytes Absolute 2.2 0.7 - 3.1 x10E3/uL   Monocytes Absolute 0.5 0.1 - 0.9 x10E3/uL   EOS (ABSOLUTE) 0.2 0.0 - 0.4 x10E3/uL   Basophils Absolute 0.0 0.0 - 0.2 x10E3/uL   Immature Granulocytes 0 Not Estab. %   Immature Grans (Abs) 0.0 0.0 - 0.1 x10E3/uL  Comprehensive metabolic panel  Result Value Ref Range   Glucose 94 70 - 99 mg/dL   BUN 14 6 - 20 mg/dL   Creatinine, Ser 0.69 0.57 - 1.00 mg/dL   eGFR 114 >59 mL/min/1.73   BUN/Creatinine Ratio 20 9 - 23   Sodium 139 134 - 144 mmol/L   Potassium 4.2 3.5 - 5.2 mmol/L   Chloride 101 96 - 106 mmol/L   CO2 26 20 - 29 mmol/L   Calcium 9.3 8.7 - 10.2 mg/dL   Total Protein 7.1 6.0 - 8.5 g/dL   Albumin 4.6 3.8 - 4.8 g/dL   Globulin, Total 2.5 1.5 - 4.5 g/dL   Albumin/Globulin Ratio 1.8 1.2 - 2.2   Bilirubin Total 0.2 0.0 - 1.2 mg/dL   Alkaline Phosphatase 124 (H) 44 - 121 IU/L   AST 20 0 - 40 IU/L   ALT 15 0 - 32 IU/L  Lipid panel  Result Value Ref Range   Cholesterol, Total 174 100 - 199 mg/dL   Triglycerides 90 0 - 149 mg/dL   HDL 63 >39 mg/dL   VLDL Cholesterol Cal 16 5 - 40 mg/dL   LDL Chol Calc (NIH) 95 0 - 99 mg/dL   Chol/HDL Ratio 2.8 0.0 - 4.4 ratio  TSH  Result Value Ref Range   TSH 1.430 0.450 - 4.500 uIU/mL  Urinalysis, Routine w reflex microscopic  Result Value Ref Range   Specific Gravity, UA 1.025 1.005 - 1.030   pH, UA 6.5 5.0 - 7.5   Color, UA Yellow Yellow  Appearance Ur Clear Clear   Leukocytes,UA Negative Negative   Protein,UA Negative Negative/Trace   Glucose, UA Negative Negative   Ketones, UA Negative Negative   RBC, UA Negative Negative   Bilirubin, UA Negative Negative   Urobilinogen, Ur 0.2 0.2 - 1.0 mg/dL   Nitrite, UA Negative Negative      Assessment & Plan:   Problem List Items Addressed This Visit       Other   Depression - Primary    Chronic.  Controlled.  Continue with current medication regimen of  Prozac 11m.  Refill sent today. Return to clinic in 6 months for reevaluation.  Call sooner if concerns arise.          Follow up plan: Return in about 6 months (around 11/06/2022) for Physical and Fasting labs.

## 2022-09-20 ENCOUNTER — Ambulatory Visit: Payer: BC Managed Care – PPO | Admitting: Nurse Practitioner

## 2022-09-20 ENCOUNTER — Ambulatory Visit: Payer: Self-pay | Admitting: *Deleted

## 2022-09-20 ENCOUNTER — Encounter: Payer: Self-pay | Admitting: Nurse Practitioner

## 2022-09-20 VITALS — BP 115/77 | HR 76 | Temp 98.0°F | Ht 62.01 in | Wt 173.2 lb

## 2022-09-20 DIAGNOSIS — R051 Acute cough: Secondary | ICD-10-CM

## 2022-09-20 MED ORDER — HYDROCOD POLI-CHLORPHE POLI ER 10-8 MG/5ML PO SUER: 5.0000 mL | Freq: Every evening | ORAL | 0 refills | Status: DC | PRN

## 2022-09-20 MED ORDER — ALBUTEROL SULFATE HFA 108 (90 BASE) MCG/ACT IN AERS: 2.0000 | INHALATION_SPRAY | Freq: Four times a day (QID) | RESPIRATORY_TRACT | 0 refills | Status: DC | PRN

## 2022-09-20 MED ORDER — PREDNISONE 20 MG PO TABS: 40.0000 mg | ORAL_TABLET | Freq: Every day | ORAL | 0 refills | Status: AC

## 2022-09-20 NOTE — Patient Instructions (Signed)

## 2022-09-20 NOTE — Progress Notes (Signed)
Acute Office Visit  Subjective:     Patient ID: Carolyn Harrison, female    DOB: 03/11/1986, 36 y.o.   MRN: [id number]  Chief Complaint  Patient presents with   Cough    Body aches, weakness for past 3 days, took at home Covid test this morning, and was negative    Has not been feeling well for 3 days.  Started with body aches and cough.  Did a Covid test this morning and was negative.  Her daughter was positive for flu last weekend.  Has been taking Dayquil for symptoms.    Cough This is a new problem. The current episode started in the past 7 days. The problem has been unchanged. The problem occurs every few minutes. The cough is Productive of sputum. Associated symptoms include headaches, myalgias, nasal congestion, postnasal drip, rhinorrhea, a sore throat and shortness of breath (going up or down stairs with coughing). Pertinent negatives include no chest pain, chills, ear congestion, ear pain, fever, heartburn, rash, weight loss or wheezing. Nothing aggravates the symptoms. She has tried OTC cough suppressant and rest for the symptoms. The treatment provided mild relief. There is no history of asthma, bronchitis, COPD or pneumonia.   Patient is in today for cough.  Review of Systems  Constitutional:  Positive for malaise/fatigue. Negative for chills, fever and weight loss.  HENT:  Positive for congestion, postnasal drip, rhinorrhea and sore throat. Negative for ear discharge, ear pain and sinus pain.   Respiratory:  Positive for cough and shortness of breath (going up or down stairs with coughing). Negative for wheezing.   Cardiovascular:  Negative for chest pain, palpitations and leg swelling.  Gastrointestinal:  Positive for nausea. Negative for constipation, diarrhea and heartburn.  Musculoskeletal:  Positive for myalgias.  Skin:  Negative for rash.  Neurological:  Positive for headaches.  Psychiatric/Behavioral: Negative.        Objective:    BP 115/77   Pulse 76   Temp  98 F (36.7 C) (Oral)   Ht 5' 2.01" (1.575 m)   Wt 173 lb 3.2 oz (78.6 kg)   SpO2 96%   BMI 31.67 kg/m  BP Readings from Last 3 Encounters:  09/20/22 115/77  05/06/22 107/70  11/06/21 103/67   Wt Readings from Last 3 Encounters:  09/20/22 173 lb 3.2 oz (78.6 kg)  05/06/22 172 lb 14.4 oz (78.4 kg)  11/06/21 164 lb 9.6 oz (74.7 kg)   Physical Exam Vitals and nursing note reviewed.  Constitutional:      General: She is awake. She is not in acute distress.    Appearance: She is well-developed and well-groomed. She is obese. She is ill-appearing. She is not toxic-appearing.  HENT:     Head: Normocephalic.     Right Ear: Hearing, ear canal and external ear normal. A middle ear effusion is present. Tympanic membrane is not injected or perforated.     Left Ear: Hearing, ear canal and external ear normal. A middle ear effusion is present. Tympanic membrane is not injected or perforated.     Nose: Rhinorrhea present. Rhinorrhea is clear.     Right Sinus: No maxillary sinus tenderness or frontal sinus tenderness.     Left Sinus: No maxillary sinus tenderness or frontal sinus tenderness.     Mouth/Throat:     Mouth: Mucous membranes are moist.     Pharynx: Posterior oropharyngeal erythema (very mild pink) present. No pharyngeal swelling or oropharyngeal exudate.  Eyes:  General: Lids are normal.        Right eye: No discharge.        Left eye: No discharge.     Conjunctiva/sclera: Conjunctivae normal.     Pupils: Pupils are equal, round, and reactive to light.  Neck:     Thyroid: No thyromegaly.     Vascular: No carotid bruit.  Cardiovascular:     Rate and Rhythm: Normal rate and regular rhythm.     Heart sounds: Normal heart sounds. No murmur heard.    No gallop.  Pulmonary:     Effort: Pulmonary effort is normal. No accessory muscle usage or respiratory distress.     Breath sounds: No decreased breath sounds, wheezing or rhonchi.  Abdominal:     General: Bowel sounds are  normal.     Palpations: Abdomen is soft. There is no hepatomegaly or splenomegaly.  Musculoskeletal:     Cervical back: Normal range of motion and neck supple.     Right lower leg: No edema.     Left lower leg: No edema.  Lymphadenopathy:     Head:     Right side of head: No submental, submandibular, tonsillar, preauricular or posterior auricular adenopathy.     Left side of head: No submental, submandibular, tonsillar, preauricular or posterior auricular adenopathy.     Cervical: No cervical adenopathy.  Skin:    General: Skin is warm and dry.  Neurological:     Mental Status: She is alert and oriented to person, place, and time.  Psychiatric:        Attention and Perception: Attention normal.        Mood and Affect: Mood normal.        Speech: Speech normal.        Behavior: Behavior normal. Behavior is cooperative.        Thought Content: Thought content normal.    No results found for any visits on 09/20/22.     Assessment & Plan:   Problem List Items Addressed This Visit       Other   Acute cough - Primary    Acute for 3 days with flu exposure, will obtain testing today, however discussed with her she is past treatment date >48 hours at this time.  Covid negative at home.  Will start Prednisone 40 MG daily for 5 days, Albuterol inhaler PRN, and Tussionex as needed.  Recommend: - Increased rest - Increasing Fluids - Acetaminophen as needed for fever/pain.  - Salt water gargling, chloraseptic spray and throat lozenges - Mucinex.  - Saline sinus flushes or a neti pot.  - Humidifying the air.       Relevant Orders   Veritor Flu A/B Waived    Meds ordered this encounter  Medications   predniSONE (DELTASONE) 20 MG tablet    Sig: Take 2 tablets (40 mg total) by mouth daily with breakfast for 5 days.    Dispense:  10 tablet    Refill:  0   albuterol (VENTOLIN HFA) 108 (90 Base) MCG/ACT inhaler    Sig: Inhale 2 puffs into the lungs every 6 (six) hours as needed for  wheezing or shortness of breath.    Dispense:  18 g    Refill:  0   chlorpheniramine-HYDROcodone (TUSSIONEX) 10-8 MG/5ML    Sig: Take 5 mLs by mouth at bedtime as needed for cough.    Dispense:  70 mL    Refill:  0    Return if symptoms worsen  or fail to improve.  Marjie Skiff, NP

## 2022-09-20 NOTE — Telephone Encounter (Signed)
  Chief Complaint: Cough Symptoms: Cough, productive at times, yellowish. Body aches, chest "Burning." Increased fatigue, throat "Raw, red." Voice hoarse. Frequency: Friday Pertinent Negatives: Patient denies fever Disposition: [] ED /[] Urgent Care (no appt availability in office) / [x] Appointment(In office/virtual)/ []  [facility]/ [] Home Care/ [] Refused Recommended Disposition /[] [facility]/ []  Follow-up with PCP Additional Notes: Appt secured for today. Care advise provided, verbalizes understanding. Pt has covid home test available,advised to test and alert if positive. Reason for Disposition  [1] Continuous (nonstop) coughing interferes with work or school AND [2] no improvement using cough treatment per Care Advice  Answer Assessment - Initial Assessment Questions 1. ONSET: "When did the cough begin?"      Friday 2. SEVERITY: "How bad is the cough today?"      Awake at night, bad spells during day 3. SPUTUM: "Describe the color of your sputum" (none, dry cough; clear, white, yellow, green)     Yellowish 4. HEMOPTYSIS: "Are you coughing up any blood?" If so ask: "How much?" (flecks, streaks, tablespoons, etc.)     no 5. DIFFICULTY BREATHING: "Are you having difficulty breathing?" If Yes, ask: "How bad is it?" (e.g., mild, moderate, severe)    - MILD: No SOB at rest, mild SOB with walking, speaks normally in sentences, can lie down, no retractions, pulse < 100.    - MODERATE: SOB at rest, SOB with minimal exertion and prefers to sit, cannot lie down flat, speaks in phrases, mild retractions, audible wheezing, pulse 100-120.    - SEVERE: Very SOB at rest, speaks in single words, struggling to breathe, sitting hunched forward, retractions, pulse > 120      no 6. FEVER: "Do you have a fever?" If Yes, ask: "What is your temperature, how was it measured, and when did it start?"     no 7. CARDIAC HISTORY: "Do you have any history of heart disease?" (e.g., heart attack,  congestive heart failure)      no 8. LUNG HISTORY: "Do you have any history of lung disease?"  (e.g., pulmonary embolus, asthma, emphysema)     no 9. PE RISK FACTORS: "Do you have a history of blood clots?" (or: recent major surgery, recent prolonged travel, bedridden)     No 10. OTHER SYMPTOMS: "Do you have any other symptoms?" (e.g., runny nose, wheezing, chest pain)       Chest "Burning"  Body aches, little better this weekend, throat raw, red. Voice hoarse.  Protocols used: Cough - Acute Productive-A-AH

## 2022-09-20 NOTE — Assessment & Plan Note (Signed)
Acute for 3 days with flu exposure, will obtain testing today, however discussed with her she is past treatment date >48 hours at this time.  Covid negative at home.  Will start Prednisone 40 MG daily for 5 days, Albuterol inhaler PRN, and Tussionex as needed.  Recommend: - Increased rest - Increasing Fluids - Acetaminophen as needed for fever/pain.  - Salt water gargling, chloraseptic spray and throat lozenges - Mucinex.  - Saline sinus flushes or a neti pot.  - Humidifying the air.

## 2022-09-21 LAB — VERITOR FLU A/B WAIVED
Influenza A: POSITIVE — AB
Influenza B: NEGATIVE

## 2022-09-21 NOTE — Progress Notes (Signed)
Notified in a MyChart message

## 2022-11-10 ENCOUNTER — Ambulatory Visit (INDEPENDENT_AMBULATORY_CARE_PROVIDER_SITE_OTHER): Payer: BC Managed Care – PPO | Admitting: Nurse Practitioner

## 2022-11-10 ENCOUNTER — Encounter: Payer: Self-pay | Admitting: Nurse Practitioner

## 2022-11-10 VITALS — BP 115/73 | HR 66 | Temp 98.1°F | Ht 62.0 in | Wt 175.1 lb

## 2022-11-10 DIAGNOSIS — Z136 Encounter for screening for cardiovascular disorders: Secondary | ICD-10-CM | POA: Diagnosis not present

## 2022-11-10 DIAGNOSIS — Z0000 Encounter for general adult medical examination without abnormal findings: Secondary | ICD-10-CM

## 2022-11-10 DIAGNOSIS — F341 Dysthymic disorder: Secondary | ICD-10-CM

## 2022-11-10 MED ORDER — BUSPIRONE HCL 5 MG PO TABS: 5.0000 mg | ORAL_TABLET | Freq: Two times a day (BID) | ORAL | 0 refills | Status: DC

## 2022-11-10 MED ORDER — FLUOXETINE HCL 20 MG PO CAPS: 20.0000 mg | ORAL_CAPSULE | Freq: Every day | ORAL | 1 refills | Status: DC

## 2022-11-10 MED ORDER — FLUOXETINE HCL 40 MG PO CAPS: 40.0000 mg | ORAL_CAPSULE | Freq: Every day | ORAL | 1 refills | Status: DC

## 2022-11-10 NOTE — Progress Notes (Unsigned)
BP 115/73   Pulse 66   Temp 98.1 F (36.7 C) (Oral)   Ht 5\' 2"  (1.575 m)   Wt 175 lb 1.6 oz (79.4 kg)   SpO2 98%   BMI 32.03 kg/m    Subjective:    Patient ID: Carolyn Harrison, female    DOB: 1986/01/05, 37 y.o.   MRN: [id number]  HPI: Carolyn Harrison is a 37 y.o. female presenting on 11/10/2022 for comprehensive medical examination. Current medical complaints include:none  She currently lives with: Menopausal Symptoms: no   Denies HA, CP, SOB, dizziness, palpitations, visual changes, and lower extremity swelling.  MOOD Patient states she is feeling more anxious lately.  She has had three recent panic attacks.  Still taking her Fluoxetine.  Not sure what has triggered her increase in anxiety.  Denies SI.   Depression Screen done today and results listed below:     11/10/2022    4:07 PM 09/20/2022    3:32 PM 05/06/2022    2:00 PM 11/06/2021    3:51 PM 10/09/2021    9:38 AM  Depression screen PHQ 2/9  Decreased Interest 1 0 0 0 1  Down, Depressed, Hopeless 2 0 1 1 2   PHQ - 2 Score 3 0 1 1 3   Altered sleeping 2 1 1  0 2  Tired, decreased energy 2 1 1 1 2   Change in appetite 1 0 0 0 1  Feeling bad or failure about yourself  2 0 1 1 2   Trouble concentrating 0 0 0 0 0  Moving slowly or fidgety/restless 0 0 0 0 0  Suicidal thoughts 0 0 0 0 0  PHQ-9 Score 10 2 4 3 10   Difficult doing work/chores Somewhat difficult Not difficult at all Not difficult at all Somewhat difficult       11/10/2022    4:07 PM 09/20/2022    3:32 PM 05/06/2022    2:00 PM 11/06/2021    3:51 PM  GAD 7 : Generalized Anxiety Score  Nervous, Anxious, on Edge 3 1 1 1   Control/stop worrying 3 1 1 1   Worry too much - different things 3 1 1 1   Trouble relaxing 2 0 0 0  Restless 1 0 0 0  Easily annoyed or irritable 3 0 0 0  Afraid - awful might happen 1 0 0 0  Total GAD 7 Score 16 3 3 3   Anxiety Difficulty Very difficult Not difficult at all Not difficult at all Somewhat difficult      The patient does not  have a history of falls. I did complete a risk assessment for falls. A plan of care for falls was documented.   Past Medical History:  Past Medical History:  Diagnosis Date   Anxiety    Breech presentation 08/15/2010   Gestational diabetes    History of Helicobacter pylori infection    2015   History of Papanicolaou smear of cervix 07/09/2011; 03/13/15   NEG;ASCUS, HPV -;   Rh(D) positive    Spontaneous abortion 2010    Surgical History:  Past Surgical History:  Procedure Laterality Date   CESAREAN SECTION  08/25/2010   BREECH PRESENTATION   CESAREAN SECTION  04/23/2021   Procedure: CESAREAN SECTION;  Surgeon: Will Bonnet, MD;  Location: [facility] ORS;  Service: Obstetrics;;   GANGLION CYST EXCISION Right 2011   INTRAUTERINE DEVICE (IUD) INSERTION  07/12/2012   TONSILLECTOMY  1994   UPPER GI ENDOSCOPY  12/2013   SL INFLAMMATION OF  STOMACH LINING TESTED POS FOR H PYLORI    Medications:  Current Outpatient Medications on File Prior to Visit  Medication Sig   levonorgestrel (MIRENA) 20 MCG/DAY IUD 1 each by Intrauterine route once for 1 dose.   No current facility-administered medications on file prior to visit.    Allergies:  Allergies  Allergen Reactions   Keflex [Cephalexin]     Social History:  Social History   Socioeconomic History   Marital status: Married    Spouse name: Not on file   Number of children: 3   Years of education: 16   Highest education level: Not on file  Occupational History   Occupation: TEACHER    Comment: [employer]  Tobacco Use   Smoking status: Never   Smokeless tobacco: Never  Vaping Use   Vaping Use: Never used  Substance and Sexual Activity   Alcohol use: No   Drug use: No   Sexual activity: Yes    Partners: Male    Birth control/protection: I.U.D.  Other Topics Concern   Not on file  Social History Narrative   Not on file   Social Determinants of Health   Financial Resource Strain: Not on file  Food Insecurity:  Not on file  Transportation Needs: Not on file  Physical Activity: Not on file  Stress: Not on file  Social Connections: Not on file  Intimate Partner Violence: Not on file   Social History   Tobacco Use  Smoking Status Never  Smokeless Tobacco Never   Social History   Substance and Sexual Activity  Alcohol Use No    Family History:  Family History  Problem Relation Age of Onset   Ulcers Mother        stomach   Diabetes Maternal Grandmother        GESTATIONAL; TYPE 2   Cancer Paternal Grandmother        LEUKEMIA    Past medical history, surgical history, medications, allergies, family history and social history reviewed with patient today and changes made to appropriate areas of the chart.   Review of Systems  Eyes:  Negative for blurred vision and double vision.  Respiratory:  Negative for shortness of breath.   Cardiovascular:  Negative for chest pain, palpitations and leg swelling.  Neurological:  Negative for dizziness and headaches.   All other ROS negative except what is listed above and in the HPI.      Objective:    BP 115/73   Pulse 66   Temp 98.1 F (36.7 C) (Oral)   Ht 5\' 2"  (1.575 m)   Wt 175 lb 1.6 oz (79.4 kg)   SpO2 98%   BMI 32.03 kg/m   Wt Readings from Last 3 Encounters:  11/10/22 175 lb 1.6 oz (79.4 kg)  09/20/22 173 lb 3.2 oz (78.6 kg)  05/06/22 172 lb 14.4 oz (78.4 kg)    Physical Exam Vitals and nursing note reviewed.  Constitutional:      General: She is awake. She is not in acute distress.    Appearance: Normal appearance. She is well-developed. She is not ill-appearing.  HENT:     Head: Normocephalic and atraumatic.     Right Ear: Hearing, tympanic membrane, ear canal and external ear normal. No drainage.     Left Ear: Hearing, tympanic membrane, ear canal and external ear normal. No drainage.     Nose: Nose normal.     Right Sinus: No maxillary sinus tenderness or frontal sinus tenderness.  Left Sinus: No maxillary sinus  tenderness or frontal sinus tenderness.     Mouth/Throat:     Mouth: Mucous membranes are moist.     Pharynx: Oropharynx is clear. Uvula midline. No pharyngeal swelling, oropharyngeal exudate or posterior oropharyngeal erythema.  Eyes:     General: Lids are normal.        Right eye: No discharge.        Left eye: No discharge.     Extraocular Movements: Extraocular movements intact.     Conjunctiva/sclera: Conjunctivae normal.     Pupils: Pupils are equal, round, and reactive to light.     Visual Fields: Right eye visual fields normal and left eye visual fields normal.  Neck:     Thyroid: No thyromegaly.     Vascular: No carotid bruit.     Trachea: Trachea normal.  Cardiovascular:     Rate and Rhythm: Normal rate and regular rhythm.     Heart sounds: Normal heart sounds. No murmur heard.    No gallop.  Pulmonary:     Effort: Pulmonary effort is normal. No accessory muscle usage or respiratory distress.     Breath sounds: Normal breath sounds.  Chest:  Breasts:    Right: Normal.     Left: Normal.  Abdominal:     General: Bowel sounds are normal.     Palpations: Abdomen is soft. There is no hepatomegaly or splenomegaly.     Tenderness: There is no abdominal tenderness.  Musculoskeletal:        General: Normal range of motion.     Cervical back: Normal range of motion and neck supple.     Right lower leg: No edema.     Left lower leg: No edema.  Lymphadenopathy:     Head:     Right side of head: No submental, submandibular, tonsillar, preauricular or posterior auricular adenopathy.     Left side of head: No submental, submandibular, tonsillar, preauricular or posterior auricular adenopathy.     Cervical: No cervical adenopathy.     Upper Body:     Right upper body: No supraclavicular, axillary or pectoral adenopathy.     Left upper body: No supraclavicular, axillary or pectoral adenopathy.  Skin:    General: Skin is warm and dry.     Capillary Refill: Capillary refill takes  less than 2 seconds.     Findings: No rash.  Neurological:     Mental Status: She is alert and oriented to person, place, and time.     Gait: Gait is intact.     Deep Tendon Reflexes: Reflexes are normal and symmetric.     Reflex Scores:      Brachioradialis reflexes are 2+ on the right side and 2+ on the left side.      Patellar reflexes are 2+ on the right side and 2+ on the left side. Psychiatric:        Attention and Perception: Attention normal.        Mood and Affect: Mood normal.        Speech: Speech normal.        Behavior: Behavior normal. Behavior is cooperative.        Thought Content: Thought content normal.        Judgment: Judgment normal.     Results for orders placed or performed in visit on 11/10/22  CBC with Differential/Platelet  Result Value Ref Range   WBC 7.4 3.4 - 10.8 x10E3/uL   RBC 4.61 3.77 -  5.28 x10E6/uL   Hemoglobin 13.8 11.1 - 15.9 g/dL   Hematocrit 37.9 02.4 - 46.6 %   MCV 89 79 - 97 fL   MCH 29.9 26.6 - 33.0 pg   MCHC 33.7 31.5 - 35.7 g/dL   RDW 09.7 35.3 - 29.9 %   Platelets 227 150 - 450 x10E3/uL   Neutrophils 61 Not Estab. %   Lymphs 32 Not Estab. %   Monocytes 5 Not Estab. %   Eos 2 Not Estab. %   Basos 0 Not Estab. %   Neutrophils Absolute 4.5 1.4 - 7.0 x10E3/uL   Lymphocytes Absolute 2.4 0.7 - 3.1 x10E3/uL   Monocytes Absolute 0.4 0.1 - 0.9 x10E3/uL   EOS (ABSOLUTE) 0.1 0.0 - 0.4 x10E3/uL   Basophils Absolute 0.0 0.0 - 0.2 x10E3/uL   Immature Granulocytes 0 Not Estab. %   Immature Grans (Abs) 0.0 0.0 - 0.1 x10E3/uL  Comprehensive metabolic panel  Result Value Ref Range   Glucose 98 70 - 99 mg/dL   BUN 11 6 - 20 mg/dL   Creatinine, Ser 2.42 0.57 - 1.00 mg/dL   eGFR 683 >41 DQ/QIW/9.79   BUN/Creatinine Ratio 17 9 - 23   Sodium 138 134 - 144 mmol/L   Potassium 4.1 3.5 - 5.2 mmol/L   Chloride 99 96 - 106 mmol/L   CO2 24 20 - 29 mmol/L   Calcium 9.0 8.7 - 10.2 mg/dL   Total Protein 7.0 6.0 - 8.5 g/dL   Albumin 4.6 3.9 - 4.9  g/dL   Globulin, Total 2.4 1.5 - 4.5 g/dL   Albumin/Globulin Ratio 1.9 1.2 - 2.2   Bilirubin Total 0.3 0.0 - 1.2 mg/dL   Alkaline Phosphatase 99 44 - 121 IU/L   AST 19 0 - 40 IU/L   ALT 18 0 - 32 IU/L  Urinalysis, Routine w reflex microscopic  Result Value Ref Range   Specific Gravity, UA 1.009 1.005 - 1.030   pH, UA 7.0 5.0 - 7.5   Color, UA Yellow Yellow   Appearance Ur Clear Clear   Leukocytes,UA Negative Negative   Protein,UA Negative Negative/Trace   Glucose, UA Negative Negative   Ketones, UA Negative Negative   RBC, UA Negative Negative   Bilirubin, UA Negative Negative   Urobilinogen, Ur 0.2 0.2 - 1.0 mg/dL   Nitrite, UA Negative Negative   Microscopic Examination Comment   Lipid panel  Result Value Ref Range   Cholesterol, Total 173 100 - 199 mg/dL   Triglycerides 46 0 - 149 mg/dL   HDL 56 >89 mg/dL   VLDL Cholesterol Cal 9 5 - 40 mg/dL   LDL Chol Calc (NIH) 211 (H) 0 - 99 mg/dL   Chol/HDL Ratio 3.1 0.0 - 4.4 ratio  TSH  Result Value Ref Range   TSH 2.010 0.450 - 4.500 uIU/mL      Assessment & Plan:   Problem List Items Addressed This Visit       Other   Depression    Chronic. Anxiety is the bigger concern at this time.  Will increase fluoxetine to 60mg .  Has tried Hydroxyzine in the past for PRN use but made her too sleepy.  Will start Buspar 5mg  PRN for anxiety.  Side effects and benefits of medication discussed during visit.  Labs ordered today.  Follow up in 1 month.  Call sooner if concerns arise.       Relevant Medications   FLUoxetine (PROZAC) 40 MG capsule   FLUoxetine (PROZAC) 20 MG capsule  busPIRone (BUSPAR) 5 MG tablet   Other Visit Diagnoses     Annual physical exam    -  Primary   Health maintenance reviewed during visit today.  Labs ordered.  Declined flu shot.  Goes to GYN for PAP.   Relevant Orders   CBC with Differential/Platelet   Comprehensive metabolic panel   Lipid panel   TSH   Urinalysis, Routine w reflex microscopic    Screening for ischemic heart disease       Relevant Orders   Lipid panel        Follow up plan: Return in about 1 month (around 12/09/2022) for Depression/Anxiety FU.   LABORATORY TESTING:  - Pap smear: up to date  IMMUNIZATIONS:   - Tdap: Tetanus vaccination status reviewed: last tetanus booster within 10 years. - Influenza: Refused - Pneumovax: Not applicable - Prevnar: Not applicable - COVID: Up to date - HPV: Not applicable - Shingrix vaccine: Not applicable  SCREENING: -Mammogram: Not applicable  - Colonoscopy: Not applicable  - Bone Density: Not applicable  -Hearing Test: Not applicable  -Spirometry: Not applicable   PATIENT COUNSELING:   Advised to take 1 mg of folate supplement per day if capable of pregnancy.   Sexuality: Discussed sexually transmitted diseases, partner selection, use of condoms, avoidance of unintended pregnancy  and contraceptive alternatives.   Advised to avoid cigarette smoking.  I discussed with the patient that most people either abstain from alcohol or drink within safe limits (<=14/week and <=4 drinks/occasion for males, <=7/weeks and <= 3 drinks/occasion for females) and that the risk for alcohol disorders and other health effects rises proportionally with the number of drinks per week and how often a drinker exceeds daily limits.  Discussed cessation/primary prevention of drug use and availability of treatment for abuse.   Diet: Encouraged to adjust caloric intake to maintain  or achieve ideal body weight, to reduce intake of dietary saturated fat and total fat, to limit sodium intake by avoiding high sodium foods and not adding table salt, and to maintain adequate dietary potassium and calcium preferably from fresh fruits, vegetables, and low-fat dairy products.    stressed the importance of regular exercise  Injury prevention: Discussed safety belts, safety helmets, smoke detector, smoking near bedding or upholstery.   Dental health:  Discussed importance of regular tooth brushing, flossing, and dental visits.    NEXT PREVENTATIVE PHYSICAL DUE IN 1 YEAR. Return in about 1 month (around 12/09/2022) for Depression/Anxiety FU.

## 2022-11-11 LAB — COMPREHENSIVE METABOLIC PANEL
ALT: 18 IU/L (ref 0–32)
AST: 19 IU/L (ref 0–40)
Albumin/Globulin Ratio: 1.9 (ref 1.2–2.2)
Albumin: 4.6 g/dL (ref 3.9–4.9)
Alkaline Phosphatase: 99 IU/L (ref 44–121)
BUN/Creatinine Ratio: 17 (ref 9–23)
BUN: 11 mg/dL (ref 6–20)
Bilirubin Total: 0.3 mg/dL (ref 0.0–1.2)
CO2: 24 mmol/L (ref 20–29)
Calcium: 9 mg/dL (ref 8.7–10.2)
Chloride: 99 mmol/L (ref 96–106)
Creatinine, Ser: 0.65 mg/dL (ref 0.57–1.00)
Globulin, Total: 2.4 g/dL (ref 1.5–4.5)
Glucose: 98 mg/dL (ref 70–99)
Potassium: 4.1 mmol/L (ref 3.5–5.2)
Sodium: 138 mmol/L (ref 134–144)
Total Protein: 7 g/dL (ref 6.0–8.5)
eGFR: 115 mL/min/{1.73_m2} (ref >59)

## 2022-11-11 LAB — CBC WITH DIFFERENTIAL/PLATELET
Basophils Absolute: 0 10*3/uL (ref 0.0–0.2)
Basos: 0 %
EOS (ABSOLUTE): 0.1 10*3/uL (ref 0.0–0.4)
Eos: 2 %
Hematocrit: 40.9 % (ref 34.0–46.6)
Hemoglobin: 13.8 g/dL (ref 11.1–15.9)
Immature Grans (Abs): 0 10*3/uL (ref 0.0–0.1)
Immature Granulocytes: 0 %
Lymphocytes Absolute: 2.4 10*3/uL (ref 0.7–3.1)
Lymphs: 32 %
MCH: 29.9 pg (ref 26.6–33.0)
MCHC: 33.7 g/dL (ref 31.5–35.7)
MCV: 89 fL (ref 79–97)
Monocytes Absolute: 0.4 10*3/uL (ref 0.1–0.9)
Monocytes: 5 %
Neutrophils Absolute: 4.5 10*3/uL (ref 1.4–7.0)
Neutrophils: 61 %
Platelets: 227 10*3/uL (ref 150–450)
RBC: 4.61 x10E6/uL (ref 3.77–5.28)
RDW: 12.7 % (ref 11.7–15.4)
WBC: 7.4 10*3/uL (ref 3.4–10.8)

## 2022-11-11 LAB — URINALYSIS, ROUTINE W REFLEX MICROSCOPIC
Bilirubin, UA: NEGATIVE
Glucose, UA: NEGATIVE
Ketones, UA: NEGATIVE
Leukocytes,UA: NEGATIVE
Nitrite, UA: NEGATIVE
Protein,UA: NEGATIVE
RBC, UA: NEGATIVE
Specific Gravity, UA: 1.009 (ref 1.005–1.030)
Urobilinogen, Ur: 0.2 mg/dL (ref 0.2–1.0)
pH, UA: 7 (ref 5.0–7.5)

## 2022-11-11 LAB — LIPID PANEL
Chol/HDL Ratio: 3.1 ratio (ref 0.0–4.4)
Cholesterol, Total: 173 mg/dL (ref 100–199)
HDL: 56 mg/dL (ref >39)
LDL Chol Calc (NIH): 108 mg/dL — ABNORMAL HIGH (ref 0–99)
Triglycerides: 46 mg/dL (ref 0–149)
VLDL Cholesterol Cal: 9 mg/dL (ref 5–40)

## 2022-11-11 LAB — TSH: TSH: 2.01 u[IU]/mL (ref 0.450–4.500)

## 2022-11-11 NOTE — Assessment & Plan Note (Signed)
Chronic. Anxiety is the bigger concern at this time.  Will increase fluoxetine to 60mg .  Has tried Hydroxyzine in the past for PRN use but made her too sleepy.  Will start Buspar 5mg  PRN for anxiety.  Side effects and benefits of medication discussed during visit.  Labs ordered today.  Follow up in 1 month.  Call sooner if concerns arise.

## 2022-11-11 NOTE — Progress Notes (Signed)
Hi Pernella. It was nice to see you yesterday.  Your lab work looks good.  No concerns at this time. Continue with your current medication regimen.  Follow up as discussed.  Please let me know if you have any questions.

## 2022-12-09 ENCOUNTER — Telehealth (INDEPENDENT_AMBULATORY_CARE_PROVIDER_SITE_OTHER): Payer: BC Managed Care – PPO | Admitting: Nurse Practitioner

## 2022-12-09 ENCOUNTER — Encounter: Payer: Self-pay | Admitting: Nurse Practitioner

## 2022-12-09 DIAGNOSIS — F341 Dysthymic disorder: Secondary | ICD-10-CM

## 2022-12-09 NOTE — Progress Notes (Signed)
There were no vitals taken for this visit.   Subjective:    Patient ID: Carolyn Harrison, female    DOB: 1986-04-10, 37 y.o.   MRN: [id number]  HPI: Carolyn Harrison is a 37 y.o. female  Chief Complaint  Patient presents with   Anxiety   Depression   DEPRESSION/ANXIETY Patient states she feels better than she did before.  She does feel like the anxiety is still there.  She is not regularly using the Buspar.    Does feel like the increased dose of Fluoxetine has helped.   Flowsheet Row Video Visit from 12/09/2022 in [facility]  PHQ-9 Total Score 3         12/09/2022    3:17 PM 11/10/2022    4:07 PM 09/20/2022    3:32 PM 05/06/2022    2:00 PM  GAD 7 : Generalized Anxiety Score  Nervous, Anxious, on Edge '1 3 1 1  '$ Control/stop worrying '1 3 1 1  '$ Worry too much - different things '1 3 1 1  '$ Trouble relaxing 0 2 0 0  Restless 0 1 0 0  Easily annoyed or irritable 0 3 0 0  Afraid - awful might happen 1 1 0 0  Total GAD 7 Score '4 16 3 3  '$ Anxiety Difficulty Somewhat difficult Very difficult Not difficult at all Not difficult at all       Relevant past medical, surgical, family and social history reviewed and updated as indicated. Interim medical history since our last visit reviewed. Allergies and medications reviewed and updated.  Review of Systems  Psychiatric/Behavioral:  Positive for dysphoric mood. Negative for suicidal ideas. The patient is nervous/anxious.     Per HPI unless specifically indicated above     Objective:    There were no vitals taken for this visit.  Wt Readings from Last 3 Encounters:  11/10/22 175 lb 1.6 oz (79.4 kg)  09/20/22 173 lb 3.2 oz (78.6 kg)  05/06/22 172 lb 14.4 oz (78.4 kg)    Physical Exam Vitals and nursing note reviewed.  HENT:     Head: Normocephalic.     Right Ear: Hearing normal.     Left Ear: Hearing normal.     Nose: Nose normal.  Eyes:     Pupils: Pupils are equal, round, and reactive to light.   Pulmonary:     Effort: Pulmonary effort is normal. No respiratory distress.  Neurological:     Mental Status: She is alert.  Psychiatric:        Mood and Affect: Mood normal.        Behavior: Behavior normal.        Thought Content: Thought content normal.        Judgment: Judgment normal.     Results for orders placed or performed in visit on 11/10/22  CBC with Differential/Platelet  Result Value Ref Range   WBC 7.4 3.4 - 10.8 x10E3/uL   RBC 4.61 3.77 - 5.28 x10E6/uL   Hemoglobin 13.8 11.1 - 15.9 g/dL   Hematocrit 40.9 34.0 - 46.6 %   MCV 89 79 - 97 fL   MCH 29.9 26.6 - 33.0 pg   MCHC 33.7 31.5 - 35.7 g/dL   RDW 12.7 11.7 - 15.4 %   Platelets 227 150 - 450 x10E3/uL   Neutrophils 61 Not Estab. %   Lymphs 32 Not Estab. %   Monocytes 5 Not Estab. %   Eos 2 Not Estab. %  Basos 0 Not Estab. %   Neutrophils Absolute 4.5 1.4 - 7.0 x10E3/uL   Lymphocytes Absolute 2.4 0.7 - 3.1 x10E3/uL   Monocytes Absolute 0.4 0.1 - 0.9 x10E3/uL   EOS (ABSOLUTE) 0.1 0.0 - 0.4 x10E3/uL   Basophils Absolute 0.0 0.0 - 0.2 x10E3/uL   Immature Granulocytes 0 Not Estab. %   Immature Grans (Abs) 0.0 0.0 - 0.1 x10E3/uL  Comprehensive metabolic panel  Result Value Ref Range   Glucose 98 70 - 99 mg/dL   BUN 11 6 - 20 mg/dL   Creatinine, Ser 0.65 0.57 - 1.00 mg/dL   eGFR 115 >59 mL/min/1.73   BUN/Creatinine Ratio 17 9 - 23   Sodium 138 134 - 144 mmol/L   Potassium 4.1 3.5 - 5.2 mmol/L   Chloride 99 96 - 106 mmol/L   CO2 24 20 - 29 mmol/L   Calcium 9.0 8.7 - 10.2 mg/dL   Total Protein 7.0 6.0 - 8.5 g/dL   Albumin 4.6 3.9 - 4.9 g/dL   Globulin, Total 2.4 1.5 - 4.5 g/dL   Albumin/Globulin Ratio 1.9 1.2 - 2.2   Bilirubin Total 0.3 0.0 - 1.2 mg/dL   Alkaline Phosphatase 99 44 - 121 IU/L   AST 19 0 - 40 IU/L   ALT 18 0 - 32 IU/L  Urinalysis, Routine w reflex microscopic  Result Value Ref Range   Specific Gravity, UA 1.009 1.005 - 1.030   pH, UA 7.0 5.0 - 7.5   Color, UA Yellow Yellow    Appearance Ur Clear Clear   Leukocytes,UA Negative Negative   Protein,UA Negative Negative/Trace   Glucose, UA Negative Negative   Ketones, UA Negative Negative   RBC, UA Negative Negative   Bilirubin, UA Negative Negative   Urobilinogen, Ur 0.2 0.2 - 1.0 mg/dL   Nitrite, UA Negative Negative   Microscopic Examination Comment   Lipid panel  Result Value Ref Range   Cholesterol, Total 173 100 - 199 mg/dL   Triglycerides 46 0 - 149 mg/dL   HDL 56 >39 mg/dL   VLDL Cholesterol Cal 9 5 - 40 mg/dL   LDL Chol Calc (NIH) 108 (H) 0 - 99 mg/dL   Chol/HDL Ratio 3.1 0.0 - 4.4 ratio  TSH  Result Value Ref Range   TSH 2.010 0.450 - 4.500 uIU/mL      Assessment & Plan:   Problem List Items Addressed This Visit       Other   Depression - Primary    Chronic.  Improved from prior with increased dose of Fluoxetine.  Will increase Buspar to '5mg'$  scheduled BID.  Can increase to '10mg'$  if '5mg'$  does not seem sufficient.  Follow up in 1 month.  Call sooner if concerns arise.         Follow up plan: Return in about 1 month (around 01/09/2023) for Depression/Anxiety FU (virtual).   This visit was completed via MyChart due to the restrictions of the COVID-19 pandemic. All issues as above were discussed and addressed. Physical exam was done as above through visual confirmation on MyChart. If it was felt that the patient should be evaluated in the office, they were directed there. The patient verbally consented to this visit. Location of the patient: Home Location of the provider: Office Those involved with this call:  Provider: Jon Billings, NP CMA: Yvonna Alanis, CMA Front Desk/Registration: Lynnell Catalan This encounter was conducted via video.  I spent 30 dedicated to the care of this patient on the date of this  encounter to include previsit review of symptoms, plan of care and follow up, face to face time with the patient, and post visit ordering of testing.

## 2022-12-09 NOTE — Assessment & Plan Note (Addendum)
Chronic.  Improved from prior with increased dose of Fluoxetine.  Will increase Buspar to '5mg'$  scheduled BID.  Can increase to '10mg'$  if '5mg'$  does not seem sufficient.  Follow up in 1 month.  Call sooner if concerns arise.

## 2022-12-10 NOTE — Progress Notes (Signed)
Called the pt lmom for the pt to call the office to get scheduled

## 2022-12-14 NOTE — Progress Notes (Signed)
Pt had an virtual appointment with Santiago Glad on 12/09/2022 @ 3:20

## 2023-02-01 ENCOUNTER — Other Ambulatory Visit: Payer: Self-pay | Admitting: Nurse Practitioner

## 2023-08-17 ENCOUNTER — Other Ambulatory Visit: Payer: Self-pay | Admitting: Nurse Practitioner

## 2023-08-17 NOTE — Telephone Encounter (Signed)
Medication Refill -  Most Recent Primary Care Visit:  Provider: Larae Grooms  Department: [facility]  Visit Type: MYCHART VIDEO VISIT  Date: 12/09/2022  Medication: busPIRone (BUSPAR) 5 MG tablet [[id number]]   and   FLUoxetine (PROZAC) 40 MG capsule [[id number]]   and FLUoxetine (PROZAC) 20 MG capsule [[id number]]     Has the patient contacted their pharmacy? Yes (   Is this the correct pharmacy for this prescription? Yes If no, delete pharmacy and type the correct one.  This is the patient's preferred pharmacy:  [id number], [place] - [address] AT [facility] [address] [place] [address] Phone: [phone] Fax: [phone]   Has the prescription been filled recently? Yes  Is the patient out of the medication? Yes  Has the patient been seen for an appointment in the last year OR does the patient have an upcoming appointment? Yes  Can we respond through MyChart? Yes  Agent: Please be advised that Rx refills may take up to 3 business days. We ask that you follow-up with your pharmacy.

## 2023-08-18 MED ORDER — BUSPIRONE HCL 5 MG PO TABS: 5.0000 mg | ORAL_TABLET | Freq: Two times a day (BID) | ORAL | 0 refills | Status: DC

## 2023-08-18 MED ORDER — FLUOXETINE HCL 20 MG PO CAPS: 20.0000 mg | ORAL_CAPSULE | Freq: Every day | ORAL | 0 refills | Status: DC

## 2023-08-18 MED ORDER — FLUOXETINE HCL 40 MG PO CAPS: 40.0000 mg | ORAL_CAPSULE | Freq: Every day | ORAL | 0 refills | Status: DC

## 2023-08-18 NOTE — Telephone Encounter (Signed)
Requested Prescriptions  Pending Prescriptions Disp Refills   busPIRone (BUSPAR) 5 MG tablet 180 tablet 0    Sig: Take 1 tablet (5 mg total) by mouth 2 (two) times daily.     Psychiatry: Anxiolytics/Hypnotics - Non-controlled Passed - 08/17/2023  5:36 PM      Passed - Valid encounter within last 12 months    Recent Outpatient Visits           8 months ago Persistent depressive disorder   [place] [facility] Larae Grooms, NP   9 months ago Annual physical exam   [place] [facility] Larae Grooms, NP   11 months ago Acute cough   [place] [facility], Corrie Dandy T, NP   1 year ago Persistent depressive disorder   [place] [facility] Larae Grooms, NP   1 year ago Annual physical exam   [place] [facility] Larae Grooms, NP       Future Appointments             In 2 weeks Larae Grooms, NP [place] [facility]             FLUoxetine (PROZAC) 20 MG capsule 90 capsule 0    Sig: Take 1 capsule (20 mg total) by mouth daily.     Psychiatry:  Antidepressants - SSRI Failed - 08/17/2023  5:36 PM      Failed - Valid encounter within last 6 months    Recent Outpatient Visits           8 months ago Persistent depressive disorder   [place] [facility] Larae Grooms, NP   9 months ago Annual physical exam   [place] [facility] Larae Grooms, NP   11 months ago Acute cough   [place] [facility], Corrie Dandy T, NP   1 year ago Persistent depressive disorder   [place] [facility] Larae Grooms, NP   1 year ago Annual physical exam   [place] [facility] Larae Grooms, NP       Future Appointments             In 2 weeks Larae Grooms, NP [place] [facility]            Passed - Completed PHQ-2 or PHQ-9 in the last 360  days       FLUoxetine (PROZAC) 40 MG capsule 90 capsule 0    Sig: Take 1 capsule (40 mg total) by mouth daily.     Psychiatry:  Antidepressants - SSRI Failed - 08/17/2023  5:36 PM      Failed - Valid encounter within last 6 months    Recent Outpatient Visits           8 months ago Persistent depressive disorder   [place] [facility] Larae Grooms, NP   9 months ago Annual physical exam   [place] [facility] Larae Grooms, NP   11 months ago Acute cough   [place] [facility] [place], Corrie Dandy T, NP   1 year ago Persistent depressive disorder   [place] [facility] Larae Grooms, NP   1 year ago Annual physical exam   [place] [facility] Larae Grooms, NP       Future Appointments             In 2 weeks Larae Grooms,  NP [place] [facility]            Passed - Completed PHQ-2 or PHQ-9 in the last 360 days

## 2023-09-07 ENCOUNTER — Ambulatory Visit: Payer: BC Managed Care – PPO | Admitting: Nurse Practitioner

## 2023-11-22 ENCOUNTER — Telehealth: Payer: Self-pay

## 2023-11-22 NOTE — Telephone Encounter (Signed)
 Ok for [facility] to review.  Unable to switch patient to virtual visit due to in person appointment need. Please schedule her for next available physical or send message for assistance if needed.

## 2023-11-23 ENCOUNTER — Encounter: Payer: Self-pay | Admitting: Nurse Practitioner

## 2023-12-13 ENCOUNTER — Ambulatory Visit (INDEPENDENT_AMBULATORY_CARE_PROVIDER_SITE_OTHER): Payer: Self-pay | Admitting: Nurse Practitioner

## 2023-12-13 ENCOUNTER — Encounter: Payer: Self-pay | Admitting: Nurse Practitioner

## 2023-12-13 ENCOUNTER — Other Ambulatory Visit (HOSPITAL_COMMUNITY)
Admission: RE | Admit: 2023-12-13 | Discharge: 2023-12-13 | Disposition: A | Discharge status: Home / Self Care | Source: Ambulatory Visit | Attending: Nurse Practitioner | Admitting: Nurse Practitioner

## 2023-12-13 VITALS — BP 118/80 | HR 78 | Ht 62.0 in | Wt 188.4 lb

## 2023-12-13 DIAGNOSIS — E7800 Pure hypercholesterolemia, unspecified: Secondary | ICD-10-CM

## 2023-12-13 DIAGNOSIS — R5383 Other fatigue: Secondary | ICD-10-CM

## 2023-12-13 DIAGNOSIS — F341 Dysthymic disorder: Secondary | ICD-10-CM | POA: Diagnosis not present

## 2023-12-13 DIAGNOSIS — Z0000 Encounter for general adult medical examination without abnormal findings: Secondary | ICD-10-CM | POA: Insufficient documentation

## 2023-12-13 DIAGNOSIS — Z1231 Encounter for screening mammogram for malignant neoplasm of breast: Secondary | ICD-10-CM | POA: Diagnosis not present

## 2023-12-13 MED ORDER — BUSPIRONE HCL 5 MG PO TABS
5.0000 mg | ORAL_TABLET | Freq: Two times a day (BID) | ORAL | 0 refills | Status: DC
Start: 2023-12-13 — End: 2024-06-14

## 2023-12-13 MED ORDER — FLUOXETINE HCL 20 MG PO CAPS: 20.0000 mg | ORAL_CAPSULE | Freq: Every day | ORAL | 1 refills | Status: DC

## 2023-12-13 MED ORDER — FLUOXETINE HCL 40 MG PO CAPS: 40.0000 mg | ORAL_CAPSULE | Freq: Every day | ORAL | 1 refills | Status: DC

## 2023-12-13 NOTE — Progress Notes (Signed)
 BP 118/80 (BP Location: Left Arm, Patient Position: Sitting, Cuff Size: Large)   Pulse 78   Ht 5\' 2"  (1.575 m)   Wt 188 lb 6.4 oz (85.5 kg)   SpO2 95%   BMI 34.46 kg/m    Subjective:    Patient ID: Carolyn Harrison, female    DOB: 1985/10/28, 38 y.o.   MRN: [id number]  HPI: Carolyn Harrison is a 38 y.o. female presenting on 12/13/2023 for comprehensive medical examination. Current medical complaints include:none  She currently lives with: Menopausal Symptoms: no   Denies HA, CP, SOB, dizziness, palpitations, visual changes, and lower extremity swelling.  Patient states she is having a lot of trouble with fatigue.  She gets a full night sleep and can take a nap at anytime of the day.    MOOD Patient states she is feeling like her anxiety has been more manageable.  She does use the buspar every now and then- maybe once a month.   Still taking her Fluoxetine.  Denies SI.   Depression Screen done today and results listed below:     12/13/2023    3:42 PM 12/09/2022    3:16 PM 11/10/2022    4:07 PM 09/20/2022    3:32 PM 05/06/2022    2:00 PM  Depression screen PHQ 2/9  Decreased Interest 0 0 1 0 0  Down, Depressed, Hopeless 1 1 2  0 1  PHQ - 2 Score 1 1 3  0 1  Altered sleeping 1 0 2 1 1   Tired, decreased energy 1 1 2 1 1   Change in appetite 0 0 1 0 0  Feeling bad or failure about yourself  1 1 2  0 1  Trouble concentrating 0 0 0 0 0  Moving slowly or fidgety/restless 0 0 0 0 0  Suicidal thoughts 0 0 0 0 0  PHQ-9 Score 4 3 10 2 4   Difficult doing work/chores Somewhat difficult Somewhat difficult Somewhat difficult Not difficult at all Not difficult at all      12/13/2023    3:41 PM 12/09/2022    3:17 PM 11/10/2022    4:07 PM 09/20/2022    3:32 PM  GAD 7 : Generalized Anxiety Score  Nervous, Anxious, on Edge 1 1 3 1   Control/stop worrying 1 1 3 1   Worry too much - different things 1 1 3 1   Trouble relaxing 0 0 2 0  Restless 0 0 1 0  Easily annoyed or irritable 1 0 3 0  Afraid -  awful might happen 0 1 1 0  Total GAD 7 Score 4 4 16 3   Anxiety Difficulty  Somewhat difficult Very difficult Not difficult at all      The patient does not have a history of falls. I did complete a risk assessment for falls. A plan of care for falls was documented.   Past Medical History:  Past Medical History:  Diagnosis Date   Anxiety    Breech presentation 08/15/2010   Gestational diabetes    History of Helicobacter pylori infection    2015   History of Papanicolaou smear of cervix 07/09/2011; 03/13/15   NEG;ASCUS, HPV -;   Rh(D) positive    Spontaneous abortion 2010    Surgical History:  Past Surgical History:  Procedure Laterality Date   CESAREAN SECTION  08/25/2010   BREECH PRESENTATION   CESAREAN SECTION  04/23/2021   Procedure: CESAREAN SECTION;  Surgeon: Conard Novak, MD;  Location: [facility] ORS;  Service: Obstetrics;;  GANGLION CYST EXCISION Right 2011   INTRAUTERINE DEVICE (IUD) INSERTION  07/12/2012   TONSILLECTOMY  1994   UPPER GI ENDOSCOPY  12/2013   SL INFLAMMATION OF STOMACH LINING TESTED POS FOR H PYLORI    Medications:  Current Outpatient Medications on File Prior to Visit  Medication Sig   levonorgestrel (MIRENA) 20 MCG/DAY IUD 1 each by Intrauterine route once for 1 dose.   No current facility-administered medications on file prior to visit.    Allergies:  Allergies  Allergen Reactions   Keflex [Cephalexin]     Social History:  Social History   Socioeconomic History   Marital status: Married    Spouse name: Not on file   Number of children: 3   Years of education: 16   Highest education level: Not on file  Occupational History   Occupation: TEACHER    Comment: [employer]  Tobacco Use   Smoking status: Never   Smokeless tobacco: Never  Vaping Use   Vaping status: Never Used  Substance and Sexual Activity   Alcohol use: No   Drug use: No   Sexual activity: Yes    Partners: Male    Birth control/protection: I.U.D.  Other  Topics Concern   Not on file  Social History Narrative   Not on file   Social Drivers of Health   Financial Resource Strain: Not on file  Food Insecurity: Not on file  Transportation Needs: Not on file  Physical Activity: Not on file  Stress: Not on file  Social Connections: Not on file  Intimate Partner Violence: Not on file   Social History   Tobacco Use  Smoking Status Never  Smokeless Tobacco Never   Social History   Substance and Sexual Activity  Alcohol Use No    Family History:  Family History  Problem Relation Age of Onset   Ulcers Mother        stomach   Diabetes Maternal Grandmother        GESTATIONAL; TYPE 2   Cancer Paternal Grandmother        LEUKEMIA    Past medical history, surgical history, medications, allergies, family history and social history reviewed with patient today and changes made to appropriate areas of the chart.   Review of Systems  Eyes:  Negative for blurred vision and double vision.  Respiratory:  Negative for shortness of breath.   Cardiovascular:  Negative for chest pain, palpitations and leg swelling.  Neurological:  Negative for dizziness and headaches.   All other ROS negative except what is listed above and in the HPI.      Objective:    BP 118/80 (BP Location: Left Arm, Patient Position: Sitting, Cuff Size: Large)   Pulse 78   Ht 5\' 2"  (1.575 m)   Wt 188 lb 6.4 oz (85.5 kg)   SpO2 95%   BMI 34.46 kg/m   Wt Readings from Last 3 Encounters:  12/13/23 188 lb 6.4 oz (85.5 kg)  11/10/22 175 lb 1.6 oz (79.4 kg)  09/20/22 173 lb 3.2 oz (78.6 kg)    Physical Exam Vitals and nursing note reviewed. Exam conducted with a chaperone present Oswaldo Conroy, CMA).  Constitutional:      General: She is awake. She is not in acute distress.    Appearance: Normal appearance. She is well-developed. She is not ill-appearing.  HENT:     Head: Normocephalic and atraumatic.     Right Ear: Hearing, tympanic membrane, ear canal and  external ear normal.  No drainage.     Left Ear: Hearing, tympanic membrane, ear canal and external ear normal. No drainage.     Nose: Nose normal.     Right Sinus: No maxillary sinus tenderness or frontal sinus tenderness.     Left Sinus: No maxillary sinus tenderness or frontal sinus tenderness.     Mouth/Throat:     Mouth: Mucous membranes are moist.     Pharynx: Oropharynx is clear. Uvula midline. No pharyngeal swelling, oropharyngeal exudate or posterior oropharyngeal erythema.  Eyes:     General: Lids are normal.        Right eye: No discharge.        Left eye: No discharge.     Extraocular Movements: Extraocular movements intact.     Conjunctiva/sclera: Conjunctivae normal.     Pupils: Pupils are equal, round, and reactive to light.     Visual Fields: Right eye visual fields normal and left eye visual fields normal.  Neck:     Thyroid: No thyromegaly.     Vascular: No carotid bruit.     Trachea: Trachea normal.  Cardiovascular:     Rate and Rhythm: Normal rate and regular rhythm.     Heart sounds: Normal heart sounds. No murmur heard.    No gallop.  Pulmonary:     Effort: Pulmonary effort is normal. No accessory muscle usage or respiratory distress.     Breath sounds: Normal breath sounds.  Chest:  Breasts:    Right: Normal.     Left: Normal.  Abdominal:     General: Bowel sounds are normal.     Palpations: Abdomen is soft. There is no hepatomegaly or splenomegaly.     Tenderness: There is no abdominal tenderness.  Genitourinary:    Vagina: Normal.     Cervix: Normal.     Comments: Mirena Strings visualized Musculoskeletal:        General: Normal range of motion.     Cervical back: Normal range of motion and neck supple.     Right lower leg: No edema.     Left lower leg: No edema.  Lymphadenopathy:     Head:     Right side of head: No submental, submandibular, tonsillar, preauricular or posterior auricular adenopathy.     Left side of head: No submental,  submandibular, tonsillar, preauricular or posterior auricular adenopathy.     Cervical: No cervical adenopathy.     Upper Body:     Right upper body: No supraclavicular, axillary or pectoral adenopathy.     Left upper body: No supraclavicular, axillary or pectoral adenopathy.  Skin:    General: Skin is warm and dry.     Capillary Refill: Capillary refill takes less than 2 seconds.     Findings: No rash.  Neurological:     Mental Status: She is alert and oriented to person, place, and time.     Gait: Gait is intact.     Deep Tendon Reflexes: Reflexes are normal and symmetric.     Reflex Scores:      Brachioradialis reflexes are 2+ on the right side and 2+ on the left side.      Patellar reflexes are 2+ on the right side and 2+ on the left side. Psychiatric:        Attention and Perception: Attention normal.        Mood and Affect: Mood normal.        Speech: Speech normal.        Behavior:  Behavior normal. Behavior is cooperative.        Thought Content: Thought content normal.        Judgment: Judgment normal.     Results for orders placed or performed in visit on 11/10/22  CBC with Differential/Platelet   Collection Time: 11/10/22 12:00 AM  Result Value Ref Range   WBC 7.4 3.4 - 10.8 x10E3/uL   RBC 4.61 3.77 - 5.28 x10E6/uL   Hemoglobin 13.8 11.1 - 15.9 g/dL   Hematocrit 16.1 09.6 - 46.6 %   MCV 89 79 - 97 fL   MCH 29.9 26.6 - 33.0 pg   MCHC 33.7 31.5 - 35.7 g/dL   RDW 04.5 40.9 - 81.1 %   Platelets 227 150 - 450 x10E3/uL   Neutrophils 61 Not Estab. %   Lymphs 32 Not Estab. %   Monocytes 5 Not Estab. %   Eos 2 Not Estab. %   Basos 0 Not Estab. %   Neutrophils Absolute 4.5 1.4 - 7.0 x10E3/uL   Lymphocytes Absolute 2.4 0.7 - 3.1 x10E3/uL   Monocytes Absolute 0.4 0.1 - 0.9 x10E3/uL   EOS (ABSOLUTE) 0.1 0.0 - 0.4 x10E3/uL   Basophils Absolute 0.0 0.0 - 0.2 x10E3/uL   Immature Granulocytes 0 Not Estab. %   Immature Grans (Abs) 0.0 0.0 - 0.1 x10E3/uL  Comprehensive  metabolic panel   Collection Time: 11/10/22 12:00 AM  Result Value Ref Range   Glucose 98 70 - 99 mg/dL   BUN 11 6 - 20 mg/dL   Creatinine, Ser 9.14 0.57 - 1.00 mg/dL   eGFR 782 >95 AO/ZHY/8.65   BUN/Creatinine Ratio 17 9 - 23   Sodium 138 134 - 144 mmol/L   Potassium 4.1 3.5 - 5.2 mmol/L   Chloride 99 96 - 106 mmol/L   CO2 24 20 - 29 mmol/L   Calcium 9.0 8.7 - 10.2 mg/dL   Total Protein 7.0 6.0 - 8.5 g/dL   Albumin 4.6 3.9 - 4.9 g/dL   Globulin, Total 2.4 1.5 - 4.5 g/dL   Albumin/Globulin Ratio 1.9 1.2 - 2.2   Bilirubin Total 0.3 0.0 - 1.2 mg/dL   Alkaline Phosphatase 99 44 - 121 IU/L   AST 19 0 - 40 IU/L   ALT 18 0 - 32 IU/L  Urinalysis, Routine w reflex microscopic   Collection Time: 11/10/22 12:00 AM  Result Value Ref Range   Specific Gravity, UA 1.009 1.005 - 1.030   pH, UA 7.0 5.0 - 7.5   Color, UA Yellow Yellow   Appearance Ur Clear Clear   Leukocytes,UA Negative Negative   Protein,UA Negative Negative/Trace   Glucose, UA Negative Negative   Ketones, UA Negative Negative   RBC, UA Negative Negative   Bilirubin, UA Negative Negative   Urobilinogen, Ur 0.2 0.2 - 1.0 mg/dL   Nitrite, UA Negative Negative   Microscopic Examination Comment   Lipid panel   Collection Time: 11/10/22 12:00 AM  Result Value Ref Range   Cholesterol, Total 173 100 - 199 mg/dL   Triglycerides 46 0 - 149 mg/dL   HDL 56 >78 mg/dL   VLDL Cholesterol Cal 9 5 - 40 mg/dL   LDL Chol Calc (NIH) 469 (H) 0 - 99 mg/dL   Chol/HDL Ratio 3.1 0.0 - 4.4 ratio  TSH   Collection Time: 11/10/22 12:00 AM  Result Value Ref Range   TSH 2.010 0.450 - 4.500 uIU/mL      Assessment & Plan:   Problem List Items Addressed This Visit  Other   Depression   Chronic.  Controlled.  Continue with current medication regimen of Fluoxetine daily.  Refills sent today.  Labs ordered today.  Return to clinic in 6 months for reevaluation.  Call sooner if concerns arise.        Relevant Medications   busPIRone  (BUSPAR) 5 MG tablet   FLUoxetine (PROZAC) 20 MG capsule   FLUoxetine (PROZAC) 40 MG capsule   Other Visit Diagnoses       Annual physical exam    -  Primary   Health maintenance reviewed during visit today.  Labs ordered.  Vaccines reviewed.  Mammogram ordered.  PAP done.   Relevant Orders   CBC with Differential/Platelet   Comprehensive metabolic panel   Lipid panel   TSH   Urinalysis, Routine w reflex microscopic   Cytology - PAP     Elevated LDL cholesterol level       Relevant Orders   Lipid panel     Encounter for screening mammogram for malignant neoplasm of breast       Relevant Orders   MM 3D SCREENING MAMMOGRAM BILATERAL BREAST     Other fatigue       ongoing fatigue.  Will check B12 and Vitamin D at visit today.  Will make recommendations based on lab results.   Relevant Orders   Vitamin D (25 hydroxy)   B12        Follow up plan: Return in about 6 months (around 06/14/2024) for Depression/Anxiety FU.   LABORATORY TESTING:  - Pap smear: up to date  IMMUNIZATIONS:   - Tdap: Tetanus vaccination status reviewed: last tetanus booster within 10 years. - Influenza: up to date - Pneumovax: Not applicable - Prevnar: Not applicable - COVID: Up to date - HPV: Not applicable - Shingrix vaccine: Not applicable  SCREENING: -Mammogram: Not applicable  - Colonoscopy: Not applicable  - Bone Density: Not applicable  -Hearing Test: Not applicable  -Spirometry: Not applicable   PATIENT COUNSELING:   Advised to take 1 mg of folate supplement per day if capable of pregnancy.   Sexuality: Discussed sexually transmitted diseases, partner selection, use of condoms, avoidance of unintended pregnancy  and contraceptive alternatives.   Advised to avoid cigarette smoking.  I discussed with the patient that most people either abstain from alcohol or drink within safe limits (<=14/week and <=4 drinks/occasion for males, <=7/weeks and <= 3 drinks/occasion for females) and that  the risk for alcohol disorders and other health effects rises proportionally with the number of drinks per week and how often a drinker exceeds daily limits.  Discussed cessation/primary prevention of drug use and availability of treatment for abuse.   Diet: Encouraged to adjust caloric intake to maintain  or achieve ideal body weight, to reduce intake of dietary saturated fat and total fat, to limit sodium intake by avoiding high sodium foods and not adding table salt, and to maintain adequate dietary potassium and calcium preferably from fresh fruits, vegetables, and low-fat dairy products.    stressed the importance of regular exercise  Injury prevention: Discussed safety belts, safety helmets, smoke detector, smoking near bedding or upholstery.   Dental health: Discussed importance of regular tooth brushing, flossing, and dental visits.    NEXT PREVENTATIVE PHYSICAL DUE IN 1 YEAR. Return in about 6 months (around 06/14/2024) for Depression/Anxiety FU.

## 2023-12-13 NOTE — Patient Instructions (Signed)
 Please call to schedule your mammogram and/or bone density: [facility]  Address: [address], [place], [place] [address] Phone: [phone]  [facility] [address] [place],  [place]  [address] Phone: [phone]

## 2023-12-13 NOTE — Assessment & Plan Note (Signed)
Chronic.  Controlled.  Continue with current medication regimen of Fluoxetine daily.  Refills sent today.  Labs ordered today.  Return to clinic in 6 months for reevaluation.  Call sooner if concerns arise.

## 2023-12-14 ENCOUNTER — Encounter: Payer: Self-pay | Admitting: Nurse Practitioner

## 2023-12-14 LAB — URINALYSIS, ROUTINE W REFLEX MICROSCOPIC
Bilirubin, UA: NEGATIVE
Glucose, UA: NEGATIVE
Ketones, UA: NEGATIVE
Leukocytes,UA: NEGATIVE
Nitrite, UA: NEGATIVE
Protein,UA: NEGATIVE
RBC, UA: NEGATIVE
Specific Gravity, UA: 1.015 (ref 1.005–1.030)
Urobilinogen, Ur: 0.2 mg/dL (ref 0.2–1.0)
pH, UA: 6 (ref 5.0–7.5)

## 2023-12-14 LAB — CBC WITH DIFFERENTIAL/PLATELET
Basophils Absolute: 0 10*3/uL (ref 0.0–0.2)
Basos: 0 %
EOS (ABSOLUTE): 0.1 10*3/uL (ref 0.0–0.4)
Eos: 1 %
Hematocrit: 39.4 % (ref 34.0–46.6)
Hemoglobin: 13.1 g/dL (ref 11.1–15.9)
Immature Grans (Abs): 0 10*3/uL (ref 0.0–0.1)
Immature Granulocytes: 0 %
Lymphocytes Absolute: 2.1 10*3/uL (ref 0.7–3.1)
Lymphs: 28 %
MCH: 29.6 pg (ref 26.6–33.0)
MCHC: 33.2 g/dL (ref 31.5–35.7)
MCV: 89 fL (ref 79–97)
Monocytes Absolute: 0.5 10*3/uL (ref 0.1–0.9)
Monocytes: 6 %
Neutrophils Absolute: 4.7 10*3/uL (ref 1.4–7.0)
Neutrophils: 65 %
Platelets: 195 10*3/uL (ref 150–450)
RBC: 4.43 x10E6/uL (ref 3.77–5.28)
RDW: 12.1 % (ref 11.7–15.4)
WBC: 7.4 10*3/uL (ref 3.4–10.8)

## 2023-12-14 LAB — LIPID PANEL
Chol/HDL Ratio: 2.9 ratio (ref 0.0–4.4)
Cholesterol, Total: 175 mg/dL (ref 100–199)
HDL: 60 mg/dL (ref >39)
LDL Chol Calc (NIH): 101 mg/dL — ABNORMAL HIGH (ref 0–99)
Triglycerides: 76 mg/dL (ref 0–149)
VLDL Cholesterol Cal: 14 mg/dL (ref 5–40)

## 2023-12-14 LAB — COMPREHENSIVE METABOLIC PANEL
ALT: 18 IU/L (ref 0–32)
AST: 20 IU/L (ref 0–40)
Albumin: 4.3 g/dL (ref 3.9–4.9)
Alkaline Phosphatase: 99 IU/L (ref 44–121)
BUN/Creatinine Ratio: 19 (ref 9–23)
BUN: 12 mg/dL (ref 6–24)
Bilirubin Total: 0.3 mg/dL (ref 0.0–1.2)
CO2: 23 mmol/L (ref 20–29)
Calcium: 9.3 mg/dL (ref 8.7–10.2)
Chloride: 100 mmol/L (ref 96–106)
Creatinine, Ser: 0.64 mg/dL (ref 0.57–1.00)
Globulin, Total: 2.3 g/dL (ref 1.5–4.5)
Glucose: 97 mg/dL (ref 70–99)
Potassium: 4 mmol/L (ref 3.5–5.2)
Sodium: 134 mmol/L (ref 134–144)
Total Protein: 6.6 g/dL (ref 6.0–8.5)
eGFR: 114 mL/min/{1.73_m2} (ref >59)

## 2023-12-14 LAB — VITAMIN B12: Vitamin B-12: 557 pg/mL (ref 232–1245)

## 2023-12-14 LAB — VITAMIN D 25 HYDROXY (VIT D DEFICIENCY, FRACTURES): Vit D, 25-Hydroxy: 23.3 ng/mL — ABNORMAL LOW (ref 30.0–100.0)

## 2023-12-14 LAB — TSH: TSH: 1.41 u[IU]/mL (ref 0.450–4.500)

## 2023-12-14 MED ORDER — VITAMIN D (ERGOCALCIFEROL) 1.25 MG (50000 UNIT) PO CAPS: 50000.0000 [IU] | ORAL_CAPSULE | ORAL | 0 refills | Status: DC

## 2023-12-14 NOTE — Addendum Note (Signed)
 Addended by: Larae Grooms on: 12/14/2023 08:11 AM   Modules accepted: Orders

## 2023-12-15 LAB — CYTOLOGY - PAP: Diagnosis: NEGATIVE

## 2024-06-14 ENCOUNTER — Ambulatory Visit: Admitting: Nurse Practitioner

## 2024-06-14 ENCOUNTER — Encounter: Payer: Self-pay | Admitting: Nurse Practitioner

## 2024-06-14 VITALS — BP 118/76 | HR 72 | Temp 97.9°F | Ht 62.0 in | Wt 188.8 lb

## 2024-06-14 DIAGNOSIS — F341 Dysthymic disorder: Secondary | ICD-10-CM | POA: Diagnosis not present

## 2024-06-14 MED ORDER — BUSPIRONE HCL 5 MG PO TABS: 5.0000 mg | ORAL_TABLET | Freq: Two times a day (BID) | ORAL | 0 refills | Status: AC

## 2024-06-14 MED ORDER — FLUOXETINE HCL 20 MG PO CAPS: 20.0000 mg | ORAL_CAPSULE | Freq: Every day | ORAL | 1 refills | Status: AC

## 2024-06-14 MED ORDER — FLUOXETINE HCL 40 MG PO CAPS: 40.0000 mg | ORAL_CAPSULE | Freq: Every day | ORAL | 1 refills | Status: AC

## 2024-06-14 NOTE — Progress Notes (Unsigned)
 BP 118/76   Pulse 72   Temp 97.9 F (36.6 C) (Oral)   Ht 5' 2 (1.575 m)   Wt 188 lb 12.8 oz (85.6 kg)   LMP  (LMP Unknown)   SpO2 97%   BMI 34.53 kg/m    Subjective:    Patient ID: Carolyn Harrison, female    DOB: 1985/12/05, 38 y.o.   MRN: [id number]  HPI: Carolyn Harrison is a 38 y.o. female  Chief Complaint  Patient presents with   Anxiety   Depression   MOOD Patient states she is feeling like her anxiety has been better.  She feels like the last few months have been pretty good.  She doesn't feel like she has had too many lows.  Feels like her anxiety is only exacerbated when something triggers her thoughts. She feels like symptoms have improved since increasing the Fluoxetine .   Flowsheet Row Office Visit from 06/14/2024 in [facility]  PHQ-9 Total Score 2      06/14/2024    3:53 PM 12/13/2023    3:41 PM 12/09/2022    3:17 PM 11/10/2022    4:07 PM  GAD 7 : Generalized Anxiety Score  Nervous, Anxious, on Edge 0 1 1 3   Control/stop worrying 1 1 1 3   Worry too much - different things 1 1 1 3   Trouble relaxing 0 0 0 2  Restless 0 0 0 1  Easily annoyed or irritable 0 1 0 3  Afraid - awful might happen 0 0 1 1  Total GAD 7 Score 2 4 4 16   Anxiety Difficulty Not difficult at all  Somewhat difficult Very difficult      Relevant past medical, surgical, family and social history reviewed and updated as indicated. Interim medical history since our last visit reviewed. Allergies and medications reviewed and updated.  Review of Systems  Psychiatric/Behavioral:  Positive for dysphoric mood. Negative for suicidal ideas. The patient is nervous/anxious.     Per HPI unless specifically indicated above     Objective:    BP 118/76   Pulse 72   Temp 97.9 F (36.6 C) (Oral)   Ht 5' 2 (1.575 m)   Wt 188 lb 12.8 oz (85.6 kg)   LMP  (LMP Unknown)   SpO2 97%   BMI 34.53 kg/m   Wt Readings from Last 3 Encounters:  06/14/24 188 lb 12.8 oz (85.6 kg)   12/13/23 188 lb 6.4 oz (85.5 kg)  11/10/22 175 lb 1.6 oz (79.4 kg)    Physical Exam Vitals and nursing note reviewed.  Constitutional:      General: She is not in acute distress.    Appearance: Normal appearance. She is not ill-appearing, toxic-appearing or diaphoretic.  HENT:     Head: Normocephalic.     Right Ear: External ear normal.     Left Ear: External ear normal.     Nose: Nose normal.     Mouth/Throat:     Mouth: Mucous membranes are moist.     Pharynx: Oropharynx is clear.  Eyes:     General:        Right eye: No discharge.        Left eye: No discharge.     Extraocular Movements: Extraocular movements intact.     Conjunctiva/sclera: Conjunctivae normal.     Pupils: Pupils are equal, round, and reactive to light.  Cardiovascular:     Rate and Rhythm: Normal rate and regular rhythm.  Heart sounds: No murmur heard. Pulmonary:     Effort: Pulmonary effort is normal. No respiratory distress.     Breath sounds: Normal breath sounds. No wheezing or rales.  Musculoskeletal:     Cervical back: Normal range of motion and neck supple.  Skin:    General: Skin is warm and dry.     Capillary Refill: Capillary refill takes less than 2 seconds.  Neurological:     General: No focal deficit present.     Mental Status: She is alert and oriented to person, place, and time. Mental status is at baseline.  Psychiatric:        Mood and Affect: Mood normal.        Behavior: Behavior normal.        Thought Content: Thought content normal.        Judgment: Judgment normal.     Results for orders placed or performed in visit on 12/13/23  Cytology - PAP   Collection Time: 12/13/23  3:49 PM  Result Value Ref Range   Adequacy      Satisfactory for evaluation; transformation zone component PRESENT.   Diagnosis      - Negative for intraepithelial lesion or malignancy (NILM)  Urinalysis, Routine w reflex microscopic   Collection Time: 12/13/23  4:38 PM  Result Value Ref Range    Specific Gravity, UA 1.015 1.005 - 1.030   pH, UA 6.0 5.0 - 7.5   Color, UA Yellow Yellow   Appearance Ur Clear Clear   Leukocytes,UA Negative Negative   Protein,UA Negative Negative/Trace   Glucose, UA Negative Negative   Ketones, UA Negative Negative   RBC, UA Negative Negative   Bilirubin, UA Negative Negative   Urobilinogen, Ur 0.2 0.2 - 1.0 mg/dL   Nitrite, UA Negative Negative   Microscopic Examination Comment   CBC with Differential/Platelet   Collection Time: 12/13/23  4:40 PM  Result Value Ref Range   WBC 7.4 3.4 - 10.8 x10E3/uL   RBC 4.43 3.77 - 5.28 x10E6/uL   Hemoglobin 13.1 11.1 - 15.9 g/dL   Hematocrit 60.5 65.9 - 46.6 %   MCV 89 79 - 97 fL   MCH 29.6 26.6 - 33.0 pg   MCHC 33.2 31.5 - 35.7 g/dL   RDW 87.8 88.2 - 84.5 %   Platelets 195 150 - 450 x10E3/uL   Neutrophils 65 Not Estab. %   Lymphs 28 Not Estab. %   Monocytes 6 Not Estab. %   Eos 1 Not Estab. %   Basos 0 Not Estab. %   Neutrophils Absolute 4.7 1.4 - 7.0 x10E3/uL   Lymphocytes Absolute 2.1 0.7 - 3.1 x10E3/uL   Monocytes Absolute 0.5 0.1 - 0.9 x10E3/uL   EOS (ABSOLUTE) 0.1 0.0 - 0.4 x10E3/uL   Basophils Absolute 0.0 0.0 - 0.2 x10E3/uL   Immature Granulocytes 0 Not Estab. %   Immature Grans (Abs) 0.0 0.0 - 0.1 x10E3/uL  Comprehensive metabolic panel   Collection Time: 12/13/23  4:40 PM  Result Value Ref Range   Glucose 97 70 - 99 mg/dL   BUN 12 6 - 24 mg/dL   Creatinine, Ser 9.35 0.57 - 1.00 mg/dL   eGFR 885 >40 fO/fpw/8.26   BUN/Creatinine Ratio 19 9 - 23   Sodium 134 134 - 144 mmol/L   Potassium 4.0 3.5 - 5.2 mmol/L   Chloride 100 96 - 106 mmol/L   CO2 23 20 - 29 mmol/L   Calcium 9.3 8.7 - 10.2 mg/dL   Total  Protein 6.6 6.0 - 8.5 g/dL   Albumin 4.3 3.9 - 4.9 g/dL   Globulin, Total 2.3 1.5 - 4.5 g/dL   Bilirubin Total 0.3 0.0 - 1.2 mg/dL   Alkaline Phosphatase 99 44 - 121 IU/L   AST 20 0 - 40 IU/L   ALT 18 0 - 32 IU/L  Lipid panel   Collection Time: 12/13/23  4:40 PM  Result Value Ref  Range   Cholesterol, Total 175 100 - 199 mg/dL   Triglycerides 76 0 - 149 mg/dL   HDL 60 >60 mg/dL   VLDL Cholesterol Cal 14 5 - 40 mg/dL   LDL Chol Calc (NIH) 898 (H) 0 - 99 mg/dL   Chol/HDL Ratio 2.9 0.0 - 4.4 ratio  TSH   Collection Time: 12/13/23  4:40 PM  Result Value Ref Range   TSH 1.410 0.450 - 4.500 uIU/mL  Vitamin D  (25 hydroxy)   Collection Time: 12/13/23  4:40 PM  Result Value Ref Range   Vit D, 25-Hydroxy 23.3 (L) 30.0 - 100.0 ng/mL  B12   Collection Time: 12/13/23  4:40 PM  Result Value Ref Range   Vitamin B-12 557 232 - 1,245 pg/mL      Assessment & Plan:   Problem List Items Addressed This Visit       Other   Depression - Primary   Chronic.  Controlled.  Continue with current medication regimen of Fluoxetine  60mg .  Refills sent today.   Return to clinic in 6 months for reevaluation.  Call sooner if concerns arise.        Relevant Medications   busPIRone  (BUSPAR ) 5 MG tablet   FLUoxetine  (PROZAC ) 20 MG capsule   FLUoxetine  (PROZAC ) 40 MG capsule     Follow up plan: Return in about 6 months (around 12/12/2024) for Physical and Fasting labs.

## 2024-06-15 NOTE — Assessment & Plan Note (Signed)
 Chronic.  Controlled.  Continue with current medication regimen of Fluoxetine  60mg .  Refills sent today.   Return to clinic in 6 months for reevaluation.  Call sooner if concerns arise.

## 2024-10-01 ENCOUNTER — Ambulatory Visit: Admitting: Nurse Practitioner

## 2024-10-01 ENCOUNTER — Telehealth: Payer: Self-pay | Admitting: Nurse Practitioner

## 2024-10-01 ENCOUNTER — Encounter: Payer: Self-pay | Admitting: Nurse Practitioner

## 2024-10-01 ENCOUNTER — Ambulatory Visit: Payer: Self-pay | Admitting: Nurse Practitioner

## 2024-10-01 VITALS — BP 133/84 | HR 69 | Temp 98.3°F | Ht 62.0 in | Wt 187.0 lb

## 2024-10-01 DIAGNOSIS — R051 Acute cough: Secondary | ICD-10-CM

## 2024-10-01 DIAGNOSIS — J0110 Acute frontal sinusitis, unspecified: Secondary | ICD-10-CM

## 2024-10-01 LAB — VERITOR FLU A/B WAIVED
Influenza A: NEGATIVE
Influenza B: NEGATIVE

## 2024-10-01 LAB — POC COVID19 BINAXNOW: SARS Coronavirus 2 Ag: NEGATIVE

## 2024-10-01 MED ORDER — AMOXICILLIN-POT CLAVULANATE 875-125 MG PO TABS: 1.0000 | ORAL_TABLET | Freq: Two times a day (BID) | ORAL | 0 refills | Status: AC

## 2024-10-01 NOTE — Progress Notes (Signed)
 "  BP 133/84   Pulse 69   Temp 98.3 F (36.8 C) (Oral)   Ht 5' 2 (1.575 m)   Wt 187 lb (84.8 kg)   SpO2 97%   BMI 34.20 kg/m    Subjective:    Patient ID: Carolyn Harrison, female    DOB: [birth year], 38 y.o.   MRN: [id number]  HPI: Carolyn Harrison is a 38 y.o. female  Chief Complaint  Patient presents with   Nasal Congestion    Onset 09/25/24   Sore Throat   Cough   UPPER RESPIRATORY TRACT INFECTION Worst symptom: Symptoms started last Tuesday Fever: no Cough: yes Shortness of breath: no Wheezing: no Chest pain: no Chest tightness: no Chest congestion: no Nasal congestion: yes Runny nose: yes Post nasal drip: yes Sneezing: no Sore throat: yes Swollen glands: no Sinus pressure: yes Headache: yes Face pain: yes Toothache: no Ear pain: yes bilateral Ear pressure: yes bilateral Eyes red/itching:no Eye drainage/crusting: no  Vomiting: no Rash: no Fatigue: yes Sick contacts: no Strep contacts: no  Context: worse Recurrent sinusitis: no Relief with OTC cold/cough medications: yes  Treatments attempted: pseudoephedrine   Relevant past medical, surgical, family and social history reviewed and updated as indicated. Interim medical history since our last visit reviewed. Allergies and medications reviewed and updated.  Review of Systems  Constitutional:  Positive for fatigue. Negative for fever.  HENT:  Positive for congestion, ear pain, postnasal drip, rhinorrhea, sinus pressure, sinus pain, sneezing and sore throat. Negative for dental problem.   Respiratory:  Positive for cough. Negative for shortness of breath and wheezing.   Cardiovascular:  Negative for chest pain.  Gastrointestinal:  Negative for vomiting.  Skin:  Negative for rash.  Neurological:  Positive for headaches.    Per HPI unless specifically indicated above     Objective:    BP 133/84   Pulse 69   Temp 98.3 F (36.8 C) (Oral)   Ht 5' 2 (1.575 m)   Wt 187 lb (84.8 kg)   SpO2 97%    BMI 34.20 kg/m   Wt Readings from Last 3 Encounters:  10/01/24 187 lb (84.8 kg)  06/14/24 188 lb 12.8 oz (85.6 kg)  12/13/23 188 lb 6.4 oz (85.5 kg)    Physical Exam Vitals and nursing note reviewed.  Constitutional:      General: She is not in acute distress.    Appearance: Normal appearance. She is normal weight. She is not ill-appearing, toxic-appearing or diaphoretic.  HENT:     Head: Normocephalic.     Right Ear: External ear normal.     Left Ear: External ear normal.     Nose: Congestion and rhinorrhea present.     Right Sinus: Maxillary sinus tenderness and frontal sinus tenderness present.     Left Sinus: Maxillary sinus tenderness and frontal sinus tenderness present.     Mouth/Throat:     Mouth: Mucous membranes are moist.     Pharynx: Oropharynx is clear. Posterior oropharyngeal erythema present. No oropharyngeal exudate.  Eyes:     General:        Right eye: No discharge.        Left eye: No discharge.     Extraocular Movements: Extraocular movements intact.     Conjunctiva/sclera: Conjunctivae normal.     Pupils: Pupils are equal, round, and reactive to light.  Cardiovascular:     Rate and Rhythm: Normal rate and regular rhythm.     Heart sounds: No murmur  heard. Pulmonary:     Effort: Pulmonary effort is normal. No respiratory distress.     Breath sounds: Normal breath sounds. No wheezing or rales.  Musculoskeletal:     Cervical back: Normal range of motion and neck supple.  Skin:    General: Skin is warm and dry.     Capillary Refill: Capillary refill takes less than 2 seconds.  Neurological:     General: No focal deficit present.     Mental Status: She is alert and oriented to person, place, and time. Mental status is at baseline.  Psychiatric:        Mood and Affect: Mood normal.        Behavior: Behavior normal.        Thought Content: Thought content normal.        Judgment: Judgment normal.     Results for orders placed or performed in visit on  10/01/24  POC COVID-19   Collection Time: 10/01/24  3:54 PM  Result Value Ref Range   SARS Coronavirus 2 Ag Negative Negative      Assessment & Plan:   Problem List Items Addressed This Visit       Other   Acute cough   Relevant Orders   Influenza A & B (STAT)   POC COVID-19 (Completed)   Other Visit Diagnoses       Acute non-recurrent frontal sinusitis    -  Primary   Will treat with Augmentin.  Complete course of antibiotics.  Follow up if not improved.   Relevant Medications   amoxicillin -clavulanate (AUGMENTIN) 875-125 MG tablet        Follow up plan: Return if symptoms worsen or fail to improve.      "

## 2024-10-01 NOTE — Telephone Encounter (Signed)
I can see her at 3:20 today.

## 2024-10-01 NOTE — Telephone Encounter (Signed)
 Called and scheduled the patient an appointment for 3:20 this afternoon.

## 2024-10-01 NOTE — Telephone Encounter (Signed)
 Copied from CRM #[id number]. Topic: Clinical - Medical Advice >> Oct 01, 2024  8:50 AM Carolyn Harrison wrote: Patient has congestion, sore throat and pressure, getting worse

## 2024-12-13 ENCOUNTER — Encounter: Admitting: Nurse Practitioner
